# Patient Record
Sex: Female | Born: 1955
Health system: Southern US, Community
[De-identification: ages and names within clinical notes are randomized; demographics above are authoritative.]

## PROBLEM LIST (undated history)

## (undated) DIAGNOSIS — F329 Major depressive disorder, single episode, unspecified: Secondary | ICD-10-CM

## (undated) DIAGNOSIS — F32A Depression, unspecified: Secondary | ICD-10-CM

## (undated) DIAGNOSIS — T7840XA Allergy, unspecified, initial encounter: Secondary | ICD-10-CM

## (undated) DIAGNOSIS — I10 Essential (primary) hypertension: Secondary | ICD-10-CM

## (undated) HISTORY — DX: Essential (primary) hypertension: I10

## (undated) HISTORY — PX: EYE SURGERY: SHX253

## (undated) HISTORY — DX: Allergy, unspecified, initial encounter: T78.40XA

## (undated) HISTORY — PX: WISDOM TOOTH EXTRACTION: SHX21

## (undated) HISTORY — DX: Major depressive disorder, single episode, unspecified: F32.9

## (undated) HISTORY — DX: Depression, unspecified: F32.A

## (undated) HISTORY — PX: ECTOPIC PREGNANCY SURGERY: SHX613

---

## 1998-11-14 ENCOUNTER — Ambulatory Visit (HOSPITAL_COMMUNITY): Admission: RE | Admit: 1998-11-14 | Discharge: 1998-11-14 | Payer: Self-pay | Admitting: Internal Medicine

## 2000-03-18 ENCOUNTER — Other Ambulatory Visit: Admission: RE | Admit: 2000-03-18 | Discharge: 2000-03-18 | Payer: Self-pay | Admitting: Obstetrics and Gynecology

## 2002-09-06 ENCOUNTER — Other Ambulatory Visit: Admission: RE | Admit: 2002-09-06 | Discharge: 2002-09-06 | Payer: Self-pay | Admitting: Obstetrics and Gynecology

## 2003-09-17 ENCOUNTER — Other Ambulatory Visit: Admission: RE | Admit: 2003-09-17 | Discharge: 2003-09-17 | Payer: Self-pay | Admitting: Obstetrics and Gynecology

## 2004-10-15 ENCOUNTER — Other Ambulatory Visit: Admission: RE | Admit: 2004-10-15 | Discharge: 2004-10-15 | Payer: Self-pay | Admitting: Obstetrics and Gynecology

## 2004-12-03 ENCOUNTER — Other Ambulatory Visit: Admission: RE | Admit: 2004-12-03 | Discharge: 2004-12-03 | Payer: Self-pay | Admitting: Obstetrics and Gynecology

## 2006-09-17 ENCOUNTER — Emergency Department (HOSPITAL_COMMUNITY): Admission: EM | Admit: 2006-09-17 | Discharge: 2006-09-18 | Payer: Self-pay | Admitting: Emergency Medicine

## 2008-01-31 ENCOUNTER — Encounter: Payer: Self-pay | Admitting: Internal Medicine

## 2008-05-01 ENCOUNTER — Ambulatory Visit: Payer: Self-pay | Admitting: Internal Medicine

## 2008-05-20 ENCOUNTER — Ambulatory Visit: Payer: Self-pay | Admitting: Internal Medicine

## 2008-06-03 ENCOUNTER — Ambulatory Visit: Payer: Self-pay | Admitting: Internal Medicine

## 2008-06-03 DIAGNOSIS — R1013 Epigastric pain: Secondary | ICD-10-CM

## 2008-06-03 DIAGNOSIS — R1319 Other dysphagia: Secondary | ICD-10-CM | POA: Insufficient documentation

## 2008-06-03 DIAGNOSIS — K219 Gastro-esophageal reflux disease without esophagitis: Secondary | ICD-10-CM

## 2008-06-07 ENCOUNTER — Ambulatory Visit: Payer: Self-pay | Admitting: Internal Medicine

## 2008-06-07 DIAGNOSIS — K2980 Duodenitis without bleeding: Secondary | ICD-10-CM

## 2008-06-07 LAB — CONVERTED CEMR LAB: UREASE: NEGATIVE

## 2008-06-26 ENCOUNTER — Telehealth: Payer: Self-pay | Admitting: Internal Medicine

## 2008-06-28 ENCOUNTER — Telehealth: Payer: Self-pay | Admitting: Internal Medicine

## 2008-07-10 ENCOUNTER — Ambulatory Visit: Payer: Self-pay | Admitting: Internal Medicine

## 2008-07-10 DIAGNOSIS — R197 Diarrhea, unspecified: Secondary | ICD-10-CM

## 2008-07-29 ENCOUNTER — Ambulatory Visit: Payer: Self-pay | Admitting: Internal Medicine

## 2008-07-29 DIAGNOSIS — R5383 Other fatigue: Secondary | ICD-10-CM

## 2008-07-30 LAB — CONVERTED CEMR LAB
ALT: 25 units/L (ref 0–35)
Albumin: 3.9 g/dL (ref 3.5–5.2)
Bilirubin, Direct: 0.1 mg/dL (ref 0.0–0.3)
CO2: 27 meq/L (ref 19–32)
Calcium: 9.1 mg/dL (ref 8.4–10.5)
Creatinine, Ser: 0.6 mg/dL (ref 0.4–1.2)
Ferritin: 35.3 ng/mL (ref 10.0–291.0)
Folate: 11 ng/mL
GFR calc Af Amer: 136 mL/min
HCT: 42 % (ref 36.0–46.0)
Hemoglobin: 14.4 g/dL (ref 12.0–15.0)
Iron: 62 ug/dL (ref 42–145)
MCHC: 34.4 g/dL (ref 30.0–36.0)
Monocytes Absolute: 0.2 10*3/uL (ref 0.1–1.0)
Monocytes Relative: 2.4 % — ABNORMAL LOW (ref 3.0–12.0)
Neutro Abs: 4.2 10*3/uL (ref 1.4–7.7)
Neutrophils Relative %: 59.1 % (ref 43.0–77.0)
Platelets: 243 10*3/uL (ref 150–400)
RBC: 4.73 M/uL (ref 3.87–5.11)
Total Protein: 6.8 g/dL (ref 6.0–8.3)
Transferrin: 192.7 mg/dL — ABNORMAL LOW (ref >=212.0)
Vitamin B-12: 308 pg/mL (ref 211–911)
WBC: 7.1 10*3/uL (ref 4.5–10.5)

## 2008-09-17 ENCOUNTER — Encounter: Payer: Self-pay | Admitting: Internal Medicine

## 2010-02-20 ENCOUNTER — Telehealth: Payer: Self-pay | Admitting: Internal Medicine

## 2010-02-25 ENCOUNTER — Emergency Department (HOSPITAL_COMMUNITY): Admission: EM | Admit: 2010-02-25 | Discharge: 2010-02-25 | Payer: Self-pay | Admitting: Unknown Physician Specialty

## 2010-03-03 ENCOUNTER — Encounter: Admission: RE | Admit: 2010-03-03 | Discharge: 2010-03-03 | Payer: Self-pay | Admitting: Internal Medicine

## 2010-04-07 ENCOUNTER — Encounter: Admission: RE | Admit: 2010-04-07 | Discharge: 2010-04-07 | Payer: Self-pay | Admitting: Internal Medicine

## 2010-09-15 NOTE — Progress Notes (Signed)
Summary: Schedule REV  Phone Note Outgoing Call Call back at [facility] Phone [phone]   Call placed by: Harlow Mares CMA Duncan Dull),  February 20, 2010 10:12 AM Call placed to: Patient Summary of Call: No Answer.. patient is due for a rev for follow up from her last office visit  Initial call taken by: Harlow Mares CMA Duncan Dull),  February 20, 2010 10:13 AM  Follow-up for Phone Call        spoke to a child and he states that the patient "got sick and is not in the hospital" we will mail the pt a letter to remind her she is due for an office visit.  Follow-up by: Harlow Mares CMA Duncan Dull),  February 25, 2010 9:55 AM

## 2010-11-01 LAB — CBC
HCT: 41.2 % (ref 36.0–46.0)
Hemoglobin: 14.4 g/dL (ref 12.0–15.0)
MCH: 30.2 pg (ref 26.0–34.0)
MCHC: 34.8 g/dL (ref 30.0–36.0)
MCV: 86.7 fL (ref 78.0–100.0)

## 2010-11-01 LAB — POCT CARDIAC MARKERS: Myoglobin, poc: 96.1 ng/mL (ref 12–200)

## 2010-11-01 LAB — BASIC METABOLIC PANEL
BUN: 8 mg/dL (ref 6–23)
CO2: 22 mEq/L (ref 19–32)
Calcium: 9 mg/dL (ref 8.4–10.5)
Chloride: 110 mEq/L (ref 96–112)
GFR calc Af Amer: >60 mL/min (ref >60)
GFR calc non Af Amer: >60 mL/min (ref >60)
Glucose, Bld: 105 mg/dL — ABNORMAL HIGH (ref 70–99)
Potassium: 4 mEq/L (ref 3.5–5.1)

## 2010-11-01 LAB — DIFFERENTIAL
Eosinophils Absolute: 0.1 10*3/uL (ref 0.0–0.7)
Eosinophils Relative: 1 % (ref 0–5)
Lymphocytes Relative: 11 % — ABNORMAL LOW (ref 12–46)
Lymphs Abs: 1.4 10*3/uL (ref 0.7–4.0)
Monocytes Absolute: 0.7 10*3/uL (ref 0.1–1.0)

## 2010-11-01 LAB — D-DIMER, QUANTITATIVE: D-Dimer, Quant: 0.43 ug/mL-FEU (ref 0.00–0.48)

## 2011-11-30 ENCOUNTER — Encounter: Payer: Self-pay | Admitting: Internal Medicine

## 2012-11-20 ENCOUNTER — Ambulatory Visit (INDEPENDENT_AMBULATORY_CARE_PROVIDER_SITE_OTHER): Payer: BC Managed Care – PPO | Admitting: Family Medicine

## 2012-11-20 VITALS — BP 110/77 | HR 74 | Temp 97.9°F | Resp 16 | Ht 61.0 in | Wt 162.8 lb

## 2012-11-20 DIAGNOSIS — R55 Syncope and collapse: Secondary | ICD-10-CM

## 2012-11-20 DIAGNOSIS — R509 Fever, unspecified: Secondary | ICD-10-CM

## 2012-11-20 DIAGNOSIS — G8929 Other chronic pain: Secondary | ICD-10-CM

## 2012-11-20 DIAGNOSIS — R110 Nausea: Secondary | ICD-10-CM

## 2012-11-20 DIAGNOSIS — J111 Influenza due to unidentified influenza virus with other respiratory manifestations: Secondary | ICD-10-CM

## 2012-11-20 LAB — POCT CBC
HCT, POC: 43.1 % (ref 37.7–47.9)
MCHC: 32.3 g/dL (ref 31.8–35.4)
MCV: 89.1 fL (ref 80–97)
MID (cbc): 0.7 (ref 0–0.9)
MPV: 8.9 fL (ref 0–99.8)
Platelet Count, POC: 226 10*3/uL (ref 142–424)
RDW, POC: 13.7 %
WBC: 8.4 10*3/uL (ref 4.6–10.2)

## 2012-11-20 LAB — POCT UA - MICROSCOPIC ONLY
Crystals, Ur, HPF, POC: NEGATIVE
RBC, urine, microscopic: NEGATIVE

## 2012-11-20 LAB — POCT URINALYSIS DIPSTICK
Blood, UA: NEGATIVE
Leukocytes, UA: NEGATIVE
Nitrite, UA: NEGATIVE
Urobilinogen, UA: 1

## 2012-11-20 LAB — COMPREHENSIVE METABOLIC PANEL
AST: 24 U/L (ref 0–37)
BUN: 15 mg/dL (ref 6–23)
CO2: 28 mEq/L (ref 19–32)
Calcium: 9 mg/dL (ref 8.4–10.5)
Chloride: 106 mEq/L (ref 96–112)
Glucose, Bld: 86 mg/dL (ref 70–99)
Potassium: 4.3 mEq/L (ref 3.5–5.3)
Total Bilirubin: 0.4 mg/dL (ref 0.3–1.2)

## 2012-11-20 MED ORDER — ALBUTEROL SULFATE HFA 108 (90 BASE) MCG/ACT IN AERS: 2.0000 | INHALATION_SPRAY | RESPIRATORY_TRACT | Status: DC | PRN

## 2012-11-20 MED ORDER — ONDANSETRON 4 MG PO TBDP
8.0000 mg | ORAL_TABLET | Freq: Once | ORAL | Status: AC
  Administered 2012-11-20: 8 mg via ORAL

## 2012-11-20 MED ORDER — ONDANSETRON 8 MG PO TBDP: 8.0000 mg | ORAL_TABLET | Freq: Three times a day (TID) | ORAL | Status: DC | PRN

## 2012-11-20 MED ORDER — MELOXICAM 7.5 MG PO TABS: 7.5000 mg | ORAL_TABLET | Freq: Two times a day (BID) | ORAL | Status: DC | PRN

## 2012-11-20 NOTE — Progress Notes (Signed)
Subjective:    Patient ID: Leslie Romero, female    DOB: 1956/03/07, 57 y.o.   MRN: [id number]   Chief Complaint  Patient presents with  . Generalized Body Aches  . Nausea    HPI  Has been feeling horrible. Started feeling off on Saturday - was very fatigued, then had a lot of copious vomiting all during Sat night.  Then began to feel very ill on Sunday with fever and chills, did not take temp, sweats, diffuse myalgias and arthralgias, neck pain.  She hasn't been able to drink anything - just a few swallows - as it makes her nauseas (though no further vomiting since initial episode.)  Normal urination. Is having diffuse abd pains but the same as diffuse body aches.  Gets very dizzy and presyncopal - no vertigo - but feels like eyes are going to roll back into her head. Was feeling so bad that she almost called 911 to go to the ER but just kept on thinking she would feel better - finally family made her come in today.  No diarrhea or constipation - passing very little stools.  Chest pain with deep breathing - bilateral tightness below breasts radiating up to neck. No cough, does feel SHoB. No sick contacts.  Did take some advil last night due to pain.  Both knees - has chronic knee pain - were very swollen.  Using a lot of icy-hot and taking a lot of baths.  Can't get comfortable at all in any position.  Was on a course of augmentin several weeks ago for sinus infection and bronchitis.   Has been trying to cut down on tobacco - down to 1/2 ppd.  No diagnosis of chronic illness but never feels good - always feels like something is wrong - they just haven't found it yet.  Pt is unable to lay flat - when I asked her to lay down in the office for exam - she repeatedly became panicked and stating that she can't stand the feeling that her brain is jiggling - no vertigo - sensation woke her from sleep last night.  She can lay on her side ok - was laying on her side on the bed when I walked into room  initially.  Past Medical History  Diagnosis Date  . Allergy   . Depression    No current outpatient prescriptions on file prior to visit.   No current facility-administered medications on file prior to visit.   Allergies  Allergen Reactions  . Hydrocodone-Acetaminophen   . Oxycodone-Aspirin     REACTION: hallucinations/ severe itching     Review of Systems  Constitutional: Positive for fever, chills, diaphoresis, activity change, appetite change and fatigue.  HENT: Positive for neck pain and neck stiffness. Negative for ear pain, nosebleeds, congestion, sore throat, rhinorrhea, sneezing, mouth sores, trouble swallowing, voice change, postnasal drip, sinus pressure and ear discharge.   Eyes: Negative for photophobia and pain.  Respiratory: Positive for cough, chest tightness and shortness of breath.   Cardiovascular: Positive for chest pain.  Gastrointestinal: Positive for nausea, vomiting and abdominal pain. Negative for diarrhea and constipation.  Genitourinary: Positive for decreased urine volume. Negative for dysuria and frequency.  Musculoskeletal: Positive for myalgias, back pain, joint swelling, arthralgias and gait problem.  Neurological: Positive for dizziness, weakness, light-headedness and headaches. Negative for syncope.       Presyncope  Hematological: Negative for adenopathy.  Psychiatric/Behavioral: Positive for sleep disturbance.      BP 102/58  Pulse 63  Temp(Src) 97.9 F (36.6 C) (Oral)  Resp 16  Ht 5\' 1"  (1.549 m)  Wt 162 lb 12.8 oz (73.846 kg)  BMI 30.78 kg/m2  SpO2 98% Objective:   Physical Exam  Constitutional: She is oriented to person, place, and time. She appears well-developed and well-nourished. No distress.  HENT:  Head: Normocephalic and atraumatic.  Right Ear: Tympanic membrane, external ear and ear canal normal.  Left Ear: Tympanic membrane, external ear and ear canal normal.  Nose: Rhinorrhea present. No mucosal edema.  Mouth/Throat:  Uvula is midline, oropharynx is clear and moist and mucous membranes are normal. No oropharyngeal exudate.  Eyes: Conjunctivae are normal. Right eye exhibits no discharge. Left eye exhibits no discharge. No scleral icterus.  Neck: Normal range of motion. Neck supple.  Cardiovascular: Normal rate, regular rhythm, normal heart sounds and intact distal pulses.   Pulmonary/Chest: Tachypnea noted. She has decreased breath sounds. She has no wheezes. She has no rhonchi. She has no rales.  Musculoskeletal:       Right knee: Normal. She exhibits no swelling, no effusion and no erythema.       Left knee: Normal. She exhibits no swelling, no effusion and no erythema.  Lymphadenopathy:    She has no cervical adenopathy.  Neurological: She is alert and oriented to person, place, and time. No cranial nerve deficit.  Skin: Skin is warm and dry. She is not diaphoretic. No erythema.  Psychiatric: She has a normal mood and affect. Her behavior is normal.     peak flow reduced at 200 Results for orders placed in visit on 11/20/12  POCT CBC      Result Value Range   WBC 8.4  4.6 - 10.2 K/uL   Lymph, poc 2.8  0.6 - 3.4   POC LYMPH PERCENT 33.6  10 - 50 %L   MID (cbc) 0.7  0 - 0.9   POC MID % 8.4  0 - 12 %M   POC Granulocyte 4.9  2 - 6.9   Granulocyte percent 58.0  37 - 80 %G   RBC 4.84  4.04 - 5.48 M/uL   Hemoglobin 13.9  12.2 - 16.2 g/dL   HCT, POC 40.9  81.1 - 47.9 %   MCV 89.1  80 - 97 fL   MCH, POC 28.7  27 - 31.2 pg   MCHC 32.3  31.8 - 35.4 g/dL   RDW, POC 91.4     Platelet Count, POC 226  142 - 424 K/uL   MPV 8.9  0 - 99.8 fL  POCT URINALYSIS DIPSTICK      Result Value Range   Color, UA dark yellow     Clarity, UA cloudy     Glucose, UA neg     Bilirubin, UA small     Ketones, UA trace     Spec Grav, UA 1.020     Blood, UA neg     pH, UA 6.0     Protein, UA 30     Urobilinogen, UA 1.0     Nitrite, UA neg     Leukocytes, UA Negative    POCT UA - MICROSCOPIC ONLY      Result Value  Range   WBC, Ur, HPF, POC 0-1     RBC, urine, microscopic neg     Bacteria, U Microscopic trace     Mucus, UA neg     Epithelial cells, urine per micros 2-6     Crystals, Ur, HPF,  POC neg     Casts, Ur, LPF, POC neg     Yeast, UA neg     Assessment & Plan:  Influenza-like illness - given zofran 8mg  SL in office now and push water to try to get urine sample. Orthostatics attempted but pt unable to lay flat to obtain.  Continue symptomatic care w/ zofran, mobic, and prn albuterol. RTC if worsening. Pt was able to drink 2 bottles water after zofran, able to lay flat w/o trouble, orthostatics obtained and negative.  Presyncope - very odd - could be hypotension, arrhythymia vs neurological - pt very confident it is not panic. Advised rtc for further eval when feeling better to discuss prior w/u and treatments - consider amb bp monitor, holter, and/or neurology eval.  Knee pain - chronic - rtc for further eval - cons xray and mobic.  GERD - stay away from nsaids.

## 2012-11-20 NOTE — Patient Instructions (Addendum)
Viral Infections  A viral infection can be caused by different types of viruses.Most viral infections are not serious and resolve on their own. However, some infections may cause severe symptoms and may lead to further complications.  SYMPTOMS  Viruses can frequently cause:   Minor sore throat.   Aches and pains.   Headaches.   Runny nose.   Different types of rashes.   Watery eyes.   Tiredness.   Cough.   Loss of appetite.   Gastrointestinal infections, resulting in nausea, vomiting, and diarrhea.  These symptoms do not respond to antibiotics because the infection is not caused by bacteria. However, you might catch a bacterial infection following the viral infection. This is sometimes called a "superinfection." Symptoms of such a bacterial infection may include:   Worsening sore throat with pus and difficulty swallowing.   Swollen neck glands.   Chills and a high or persistent fever.   Severe headache.   Tenderness over the sinuses.   Persistent overall ill feeling (malaise), muscle aches, and tiredness (fatigue).   Persistent cough.   Yellow, green, or brown mucus production with coughing.  HOME CARE INSTRUCTIONS    Only take over-the-counter or prescription medicines for pain, discomfort, diarrhea, or fever as directed by your caregiver.   Drink enough water and fluids to keep your urine clear or pale yellow. Sports drinks can provide valuable electrolytes, sugars, and hydration.   Get plenty of rest and maintain proper nutrition. Soups and broths with crackers or rice are fine.  SEEK IMMEDIATE MEDICAL CARE IF:    You have severe headaches, shortness of breath, chest pain, neck pain, or an unusual rash.   You have uncontrolled vomiting, diarrhea, or you are unable to keep down fluids.   You or your child has an oral temperature above 102 F (38.9 C), not controlled by medicine.   Your baby is older than 3 months with a rectal temperature of 102 F (38.9 C) or higher.   Your baby is 3  months old or younger with a rectal temperature of 100.4 F (38 C) or higher.  MAKE SURE YOU:    Understand these instructions.   Will watch your condition.   Will get help right away if you are not doing well or get worse.  Document Released: 05/12/2005 Document Revised: 10/25/2011 Document Reviewed: 12/07/2010  [employer] Patient Information 2013 [employer].

## 2012-12-28 ENCOUNTER — Ambulatory Visit (INDEPENDENT_AMBULATORY_CARE_PROVIDER_SITE_OTHER): Payer: BC Managed Care – PPO | Admitting: Emergency Medicine

## 2012-12-28 VITALS — BP 142/84 | HR 89 | Temp 98.9°F | Resp 18 | Ht 61.75 in | Wt 165.2 lb

## 2012-12-28 DIAGNOSIS — J209 Acute bronchitis, unspecified: Secondary | ICD-10-CM

## 2012-12-28 DIAGNOSIS — J018 Other acute sinusitis: Secondary | ICD-10-CM

## 2012-12-28 DIAGNOSIS — R0602 Shortness of breath: Secondary | ICD-10-CM

## 2012-12-28 MED ORDER — ALBUTEROL SULFATE (2.5 MG/3ML) 0.083% IN NEBU
2.5000 mg | INHALATION_SOLUTION | Freq: Once | RESPIRATORY_TRACT | Status: AC
  Administered 2012-12-28: 2.5 mg via RESPIRATORY_TRACT

## 2012-12-28 MED ORDER — IPRATROPIUM BROMIDE 0.02 % IN SOLN
0.5000 mg | Freq: Once | RESPIRATORY_TRACT | Status: AC
  Administered 2012-12-28: 0.5 mg via RESPIRATORY_TRACT

## 2012-12-28 MED ORDER — PSEUDOEPHEDRINE-GUAIFENESIN ER 60-600 MG PO TB12: 1.0000 | ORAL_TABLET | Freq: Two times a day (BID) | ORAL | Status: DC

## 2012-12-28 MED ORDER — FLUTICASONE PROPIONATE 50 MCG/ACT NA SUSP: 4.0000 | Freq: Every day | NASAL | Status: DC

## 2012-12-28 MED ORDER — LEVOFLOXACIN 500 MG PO TABS: 500.0000 mg | ORAL_TABLET | Freq: Every day | ORAL | Status: AC

## 2012-12-28 NOTE — Patient Instructions (Addendum)
Metered Dose Inhaler (No Spacer Used) Inhaled medicines are the basis of asthma treatment and other breathing problems. Inhaled medicine can only be effective if used properly. Good technique assures that the medicine reaches the lungs. Metered dose inhalers (MDIs) are used to deliver a variety of inhaled medicines. These include quick relief medicines, controller medicines (such as corticosteroids), and cromolyn. The medicine is delivered by pushing down on a metal canister to release a set amount of spray.  If you are using different kinds of inhalers, use your quick relief medicine to open the airways 10 to 15 minutes before using a steroid. If you are unsure which inhalers to use and the order of using them, ask your caregiver, nurse, or respiratory therapist. HOW TO USE THE INHALER 1. Remove cap from inhaler. 2. Shake inhaler for 5 seconds before each inhalation (breathing in). 3. Position the inhaler so that the top of the canister faces up. 4. Put your index finger on the top of the medication canister. Your thumb supports the bottom of the inhaler. 5. Open your mouth. 6. Hold the inhaler 1 to 2 inches away from your open mouth. This allows the medicine to slow down before the medicine enters the mouth. 7. Exhale (breathe out) normally and as completely as possible. 8. Press the canister down with the index finger to release the medication. 9. At the same time as the canister is pressed, inhale deeply and slowly until the lungs are completely filled. This should take 4 to 6 seconds. Keep your tongue down. 10. Hold the medication in your lungs for up to 10 seconds (10 seconds is best). This helps the medicine get into the small airways of your lungs to work better. 11. Breathe out slowly, through pursed lips. Whistling is an example of pursed lips. 12. Wait at least 1 minute between puffs. Continue with the above steps until you have taken the number of puffs your caregiver has  ordered. 13. Replace cap on inhaler. AVOID:  Inhaling before or after starting the spray of medicine. It takes practice to coordinate your breathing with triggering the spray.  Inhaling through the nose (rather than the mouth) when triggering the spray. HOW TO DETERMINE IF YOUR INHALER IS FULL OR NEARLY EMPTY:  Determine when an inhaler is empty. You cannot know when an MDI canister is empty by shaking it. A few MDIs are now being made with dose counters. Ask your caregiver for a prescription that has a dose counter if you feel you need that extra help.  If your inhaler does not have a counter, check the number of doses in the inhaler before you use it. The canister or box will list the number of doses in the canister. Divide the total number of doses in the canister by the number you will use each day to find how many days the canister will last. (For example, if your canister has 200 doses and you take 2 puffs, 4 times each day, which is 8 puffs a day. Dividing 200 by 8 equals 25. The canister should last 25 days.) Using a calendar, count forward that many days to see when your inhaler will run out. Write the refill date on a calendar or your canister.  Remember, if you need to take extra doses, the inhaler will empty sooner than you figured. Be sure you have a refill before your canister runs out. Refill your inhaler 7 to 10 days before it runs out. HOME CARE INSTRUCTIONS   Do   not use the inhaler more than your caregiver tells you. If you are still wheezing and are feeling tightness in your chest, call your caregiver.  Keep an adequate supply of medication. This includes making sure the medicine is not expired, and you have a spare MDI.  Follow your caregiver or inhaler insert directions for cleaning the inhaler. SEEK MEDICAL CARE IF:   Symptoms are only partially relieved with your inhalers.  You are having trouble using your inhalers.  You experience some increase in phlegm.  You  develop a fever of 102 F (38.9 C). SEEK IMMEDIATE MEDICAL CARE IF:   You feel little or no relief with your inhalers. You are still wheezing and are feeling shortness of breath and/or tightness in your chest.  You have side effects such as dizziness, headaches, or fast heart rate.  You have chills, fever, night sweats or an oral temperature above 102 F (38.9 C) develops.  Phlegm production increases a lot, or there is blood in the phlegm. MAKE SURE YOU:   Understand these instructions.  Will watch your condition.  Will get help right away if you are not doing well or get worse. Document Released: 05/30/2007 Document Revised: 10/25/2011 Document Reviewed: 05/20/2009 [facility] Patient Information 2013 [place], [place].

## 2012-12-28 NOTE — Progress Notes (Signed)
[facility] [address] [place] [address] [phone]  Date:  12/28/2012   Name:  Leslie Romero   DOB:  October 16, 1955   MRN:  [id number]  PCP:  No primary provider on file.    Chief Complaint: Cough, Nasal Congestion and Shortness of Breath   History of Present Illness:  Leslie Romero is a 57 y.o. very pleasant female patient who presents with the following:  Seen three weeks ago and treated for influenza like syndrome.  Really failed to improve and she has nasal congestion and drainage, fever, chills, and a cough productive of mucopurulent sputum.  Some wheezing and shortness of breath with exertion.  No improvement with inhaler twice daily but is not compliant with instructions.  Has malaise and myalgias and severe cough.  No nausea or vomiting.  No stool change.  No rash.  No food intolerance.  Marked post nasal drainage.  Sore throat.  No improvement with over the counter medications or other home remedies. Denies other complaint or health concern today.   Patient Active Problem List   Diagnosis Date Noted  . FATIGUE 07/29/2008  . DIARRHEA-PRESUMED INFECTIOUS 07/10/2008  . DUODENITIS 06/07/2008  . GERD 06/03/2008  . DYSPHAGIA 06/03/2008  . ABDOMINAL PAIN-EPIGASTRIC 06/03/2008    Past Medical History  Diagnosis Date  . Allergy   . Depression     Past Surgical History  Procedure Laterality Date  . Eye surgery      History  Substance Use Topics  . Smoking status: Current Every Day Smoker  . Smokeless tobacco: Not on file  . Alcohol Use: No    Family History  Problem Relation Age of Onset  . Thyroid disease Mother   . COPD Mother   . Thyroid disease Sister     Allergies  Allergen Reactions  . Hydrocodone-Acetaminophen   . Oxycodone-Aspirin     REACTION: hallucinations/ severe itching    Medication list has been reviewed and updated.  Current Outpatient Prescriptions on File Prior to Visit  Medication Sig Dispense Refill  .  albuterol (PROVENTIL HFA;VENTOLIN HFA) 108 (90 BASE) MCG/ACT inhaler Inhale 2 puffs into the lungs every 4 (four) hours as needed for wheezing (cough, shortness of breath or wheezing.).  1 Inhaler  1  . meloxicam (MOBIC) 7.5 MG tablet Take 1 tablet (7.5 mg total) by mouth 2 (two) times daily as needed for pain.  30 tablet  0  . ondansetron (ZOFRAN ODT) 8 MG disintegrating tablet Take 1 tablet (8 mg total) by mouth every 8 (eight) hours as needed for nausea.  20 tablet  0   No current facility-administered medications on file prior to visit.    Review of Systems:  As per HPI, otherwise negative.    Physical Examination: Filed Vitals:   12/28/12 1545  BP: 142/84  Pulse: 89  Temp: 98.9 F (37.2 C)  Resp: 18   Filed Vitals:   12/28/12 1545  Height: 5' 1.75" (1.568 m)  Weight: 165 lb 3.2 oz (74.934 kg)   Body mass index is 30.48 kg/(m^2). Ideal Body Weight: Weight in (lb) to have BMI = 25: 135.3  GEN: WDWN, minimal distress.  Pronounced cough, Non-toxic, A & O x 3 HEENT: Atraumatic, Normocephalic. Neck supple. No masses, No LAD. Ears and Nose: No external deformity. CV: RRR, No M/G/R. No JVD. No thrill. No extra heart sounds. PULM: CTA B, diffuse wheezes, no crackles, rhonchi. No retractions. No resp. distress. No accessory muscle use. ABD:  S, NT, ND, +BS. No rebound. No HSM. EXTR: No c/c/e NEURO Normal gait.  PSYCH: Normally interactive. Conversant. Not depressed or anxious appearing.  Calm demeanor.    Assessment and Plan: Bronchitis with bronchospasm Sinusitis levaquin mucinex d flonase Neb   Signed,  Phillips Odor, MD

## 2013-06-15 ENCOUNTER — Ambulatory Visit (INDEPENDENT_AMBULATORY_CARE_PROVIDER_SITE_OTHER): Payer: BC Managed Care – PPO | Admitting: Family Medicine

## 2013-06-15 ENCOUNTER — Ambulatory Visit: Payer: BC Managed Care – PPO

## 2013-06-15 VITALS — BP 140/68 | HR 97 | Temp 99.1°F | Resp 24

## 2013-06-15 DIAGNOSIS — J209 Acute bronchitis, unspecified: Secondary | ICD-10-CM

## 2013-06-15 DIAGNOSIS — R0602 Shortness of breath: Secondary | ICD-10-CM

## 2013-06-15 DIAGNOSIS — J029 Acute pharyngitis, unspecified: Secondary | ICD-10-CM

## 2013-06-15 DIAGNOSIS — R6889 Other general symptoms and signs: Secondary | ICD-10-CM

## 2013-06-15 LAB — POCT INFLUENZA A/B
Influenza A, POC: NEGATIVE
Influenza B, POC: NEGATIVE

## 2013-06-15 LAB — POCT RAPID STREP A (OFFICE): Rapid Strep A Screen: NEGATIVE

## 2013-06-15 MED ORDER — IPRATROPIUM BROMIDE 0.02 % IN SOLN: 0.5000 mg | Freq: Once | RESPIRATORY_TRACT | Status: AC

## 2013-06-15 MED ORDER — BENZONATATE 100 MG PO CAPS: 100.0000 mg | ORAL_CAPSULE | Freq: Three times a day (TID) | ORAL | Status: DC | PRN

## 2013-06-15 MED ORDER — AMOXICILLIN-POT CLAVULANATE 875-125 MG PO TABS: 1.0000 | ORAL_TABLET | Freq: Two times a day (BID) | ORAL | Status: DC

## 2013-06-15 MED ORDER — ALBUTEROL SULFATE (2.5 MG/3ML) 0.083% IN NEBU: 2.5000 mg | INHALATION_SOLUTION | Freq: Once | RESPIRATORY_TRACT | Status: DC

## 2013-06-15 MED ORDER — METHYLPREDNISOLONE (PAK) 4 MG PO TABS: ORAL_TABLET | ORAL | Status: DC

## 2013-06-15 MED ORDER — HYDROCODONE-HOMATROPINE 5-1.5 MG/5ML PO SYRP: 5.0000 mL | ORAL_SOLUTION | Freq: Three times a day (TID) | ORAL | Status: DC | PRN

## 2013-06-15 NOTE — Patient Instructions (Signed)

## 2013-06-15 NOTE — Progress Notes (Signed)
 [facility]:  Office Visit  Chief Complaint:  Chief Complaint  Patient presents with  . Illness    cough, congestion and SOB since last night    HPI: Leslie Romero is a 57 y.o. female who is here for coughing thick yellow sputum, chest congestion x 2 days Tuesday night she had sore throat. Subjective fevers, chills and msk aches Allergies but no asthma, She smokes but has not been smoking Every year she gets bronchitis and PNA Tried flonase and otc cough meds without releif  Past Medical History  Diagnosis Date  . Allergy   . Depression    Past Surgical History  Procedure Laterality Date  . Eye surgery     History   Social History  . Marital Status: Married    Spouse Name: N/A    Number of Children: N/A  . Years of Education: N/A   Social History Main Topics  . Smoking status: Current Every Day Smoker  . Smokeless tobacco: None  . Alcohol Use: No  . Drug Use: No  . Sexual Activity: Yes    Birth Control/ Protection: None   Other Topics Concern  . None   Social History Narrative  . None   Family History  Problem Relation Age of Onset  . Thyroid disease Mother   . COPD Mother   . Thyroid disease Sister    Allergies  Allergen Reactions  . Oxycodone-Aspirin     REACTION: hallucinations/ severe itching  . Avelox [Moxifloxacin Hcl In Nacl] Rash   Prior to Admission medications   Medication Sig Start Date End Date Taking? Authorizing Provider  albuterol (PROVENTIL HFA;VENTOLIN HFA) 108 (90 BASE) MCG/ACT inhaler Inhale 2 puffs into the lungs every 4 (four) hours as needed for wheezing (cough, shortness of breath or wheezing.). 11/20/12   Sherren Mocha, MD  fluticasone (FLONASE) 50 MCG/ACT nasal spray Place 4 sprays into the nose daily. 12/28/12   Phillips Odor, MD     ROS: The patient denies  night sweats, unintentional weight loss, chest pain, palpitations, dyspnea on exertion, nausea, vomiting, abdominal pain, dysuria, hematuria, melena,  numbness, weakness, or tingling.   All other systems have been reviewed and were otherwise negative with the exception of those mentioned in the HPI and as above.    PHYSICAL EXAM: Filed Vitals:   06/15/13 1200  BP: 140/68  Pulse: 97  Temp: 99.1 F (37.3 C)  Resp: 24   There were no vitals filed for this visit. There is no weight on file to calculate BMI.  General: Alert, no acute distress HEENT:  Normocephalic, atraumatic, oropharynx patent. EOMI, PERRLA Cardiovascular:  Regular rate and rhythm, no rubs murmurs or gallops.  No Carotid bruits, radial pulse intact. No pedal edema.  Respiratory: Clear to auscultation bilaterally.  + minimal wheezes, No rales, or rhonchi.  No cyanosis, no use of accessory musculature GI: No organomegaly, abdomen is soft and non-tender, positive bowel sounds.  No masses. Skin: No rashes. Neurologic: Facial musculature symmetric. Psychiatric: Patient is appropriate throughout our interaction. Lymphatic: No cervical lymphadenopathy Musculoskeletal: Gait intact.   LABS: Results for orders placed in visit on 06/15/13  POCT RAPID STREP A (OFFICE)      Result Value Range   Rapid Strep A Screen Negative  Negative  POCT INFLUENZA A/B      Result Value Range   Influenza A, POC Negative     Influenza B, POC Negative       EKG/XRAY:  Primary read interpreted by Dr. Conley Rolls at [facility]. No pneumo Increase vascular markings less likely infiltrate left hilar region   ASSESSMENT/PLAN: Encounter Diagnoses  Name Primary?  . Flu-like symptoms   . SOB (shortness of breath)   . Acute pharyngitis   . Acute bronchitis Yes   Rx Augmetin Rx hycodan ( she has had this before) Rx Tessalon perles Rx Medrol dose pack, albuterol inhaler C/w Flonase F/u prn or if no improvement in 48-72 hours Felt better after nebs, increase air movement Gross sideeffects, risk and benefits, and alternatives of medications d/w patient. Patient is aware that all medications have  potential sideeffects and we are unable to predict every sideeffect or drug-drug interaction that may occur.  ,  PHUONG, DO 06/15/2013 1:42 PM

## 2014-03-02 ENCOUNTER — Encounter (HOSPITAL_COMMUNITY): Payer: Self-pay | Admitting: Emergency Medicine

## 2014-03-02 ENCOUNTER — Emergency Department (HOSPITAL_COMMUNITY)
Admission: EM | Admit: 2014-03-02 | Discharge: 2014-03-02 | Disposition: A | Discharge status: Home / Self Care | Payer: BC Managed Care – PPO | Attending: Emergency Medicine | Admitting: Emergency Medicine

## 2014-03-02 DIAGNOSIS — Z8659 Personal history of other mental and behavioral disorders: Secondary | ICD-10-CM | POA: Insufficient documentation

## 2014-03-02 DIAGNOSIS — J3489 Other specified disorders of nose and nasal sinuses: Secondary | ICD-10-CM | POA: Insufficient documentation

## 2014-03-02 DIAGNOSIS — F17200 Nicotine dependence, unspecified, uncomplicated: Secondary | ICD-10-CM | POA: Insufficient documentation

## 2014-03-02 DIAGNOSIS — Z7982 Long term (current) use of aspirin: Secondary | ICD-10-CM | POA: Insufficient documentation

## 2014-03-02 DIAGNOSIS — R040 Epistaxis: Secondary | ICD-10-CM | POA: Insufficient documentation

## 2014-03-02 MED ORDER — SULFAMETHOXAZOLE-TRIMETHOPRIM 800-160 MG PO TABS: 1.0000 | ORAL_TABLET | Freq: Two times a day (BID) | ORAL | Status: DC

## 2014-03-02 MED ORDER — CETIRIZINE-PSEUDOEPHEDRINE ER 5-120 MG PO TB12: 1.0000 | ORAL_TABLET | Freq: Every day | ORAL | Status: DC

## 2014-03-02 NOTE — ED Provider Notes (Signed)
Medical screening examination/treatment/procedure(s) were performed by non-physician practitioner and as supervising physician I was immediately available for consultation/collaboration.   EKG Interpretation None        Enid SkeensJoshua M Jacobey Gura, MD 03/02/14 1535

## 2014-03-02 NOTE — ED Provider Notes (Signed)
CSN: [id number]     Arrival date & time 03/02/14  1100 History   First MD Initiated Contact with Patient 03/02/14 1122     Chief Complaint  Patient presents with  . Epistaxis     (Consider location/radiation/quality/duration/timing/severity/associated sxs/prior Treatment) Patient is a 58 y.o. female presenting with nosebleeds. The history is provided by the patient. No language interpreter was used.  Epistaxis Location:  R nare Severity:  Severe Duration:  30 minutes Timing:  Constant Progression:  Resolved Chronicity:  New Context: aspirin use and recent infection   Context: not anticoagulants, not drug use, not elevation change and not nose picking   Relieved by:  Applying pressure Worsened by:  Nothing tried Ineffective treatments:  None tried Associated symptoms: congestion   Associated symptoms: no dizziness and no fever   Congestion:    Location:  Nasal   Interferes with sleep: no     Interferes with eating/drinking: no   Risk factors: allergies   Risk factors: no alcohol use, no change in medication, no frequent nosebleeds, no head and neck surgery and no sinus problems     Past Medical History  Diagnosis Date  . Allergy   . Depression    Past Surgical History  Procedure Laterality Date  . Eye surgery     Family History  Problem Relation Age of Onset  . Thyroid disease Mother   . COPD Mother   . Thyroid disease Sister    History  Substance Use Topics  . Smoking status: Current Every Day Smoker  . Smokeless tobacco: Not on file  . Alcohol Use: No   OB History   Grav Para Term Preterm Abortions TAB SAB Ect Mult Living                 Review of Systems  Constitutional: Negative for fever, chills and fatigue.  HENT: Positive for congestion and nosebleeds. Negative for trouble swallowing.   Eyes: Negative for visual disturbance.  Respiratory: Negative for shortness of breath.   Cardiovascular: Negative for chest pain and palpitations.   Gastrointestinal: Negative for nausea, vomiting, abdominal pain and diarrhea.  Genitourinary: Negative for dysuria and difficulty urinating.  Musculoskeletal: Negative for arthralgias and neck pain.  Skin: Negative for color change.  Neurological: Negative for dizziness and weakness.  Psychiatric/Behavioral: Negative for dysphoric mood.      Allergies  Avelox  Home Medications   Prior to Admission medications   Medication Sig Start Date End Date Taking? Authorizing Provider  albuterol (PROVENTIL HFA;VENTOLIN HFA) 108 (90 BASE) MCG/ACT inhaler Inhale 2 puffs into the lungs every 4 (four) hours as needed for wheezing (cough, shortness of breath or wheezing.). 11/20/12  Yes Sherren MochaEva N Shaw, MD  aspirin EC 81 MG tablet Take 81 mg by mouth daily.   Yes Historical Provider, MD  ibuprofen (ADVIL,MOTRIN) 200 MG tablet Take 200 mg by mouth every 6 (six) hours as needed for headache.   Yes Historical Provider, MD  naproxen sodium (ANAPROX) 220 MG tablet Take 220 mg by mouth 2 (two) times daily as needed. Hip and knee pain   Yes Historical Provider, MD   BP 155/87  Pulse 74  Temp(Src) 98.3 F (36.8 C) (Oral)  Resp 18  SpO2 98% Physical Exam  Nursing note and vitals reviewed. Constitutional: She is oriented to person, place, and time. She appears well-developed and well-nourished. No distress.  HENT:  Head: Normocephalic and atraumatic.  Mouth/Throat: Oropharynx is clear and moist. No oropharyngeal exudate.  Mild maxillary sinus  tenderness to palpation. Moderate amount of dried blood in right nare. No active bleeding noted. Left nare unremarkable.   Eyes: Conjunctivae and EOM are normal.  Neck: Normal range of motion.  Cardiovascular: Normal rate and regular rhythm.  Exam reveals no gallop and no friction rub.   No murmur heard. Pulmonary/Chest: Effort normal and breath sounds normal. She has no wheezes. She has no rales. She exhibits no tenderness.  Musculoskeletal: Normal range of motion.   Neurological: She is alert and oriented to person, place, and time. Coordination normal.  Speech is goal-oriented. Moves limbs without ataxia.   Skin: Skin is warm and dry.  Psychiatric: She has a normal mood and affect. Her behavior is normal.    ED Course  Procedures (including critical care time) Labs Review Labs Reviewed - No data to display  Imaging Review No results found.   EKG Interpretation None      MDM   Final diagnoses:  Acute anterior epistaxis    11:37 AM Patient is no longer having a nosebleed. Patient will be discharged with bactrim for sinusitis and zyrtec for nasal congestion. Vitals stable and patient afebrile. Patient will follow up with PCP.    Emilia Beck, PA-C 03/02/14 1144

## 2014-03-02 NOTE — ED Notes (Signed)
Per pt, woke up this morning and in kitchen to get coffee.  Began having large amount nose of bleeding from right nasal.  Drainage down throat.  Takes ASA 81 mg daily.  No other thinners.  Pt packed nose.  No hx of same.  Has recently been treated for sinus infection and has finished antibiotic.

## 2014-03-02 NOTE — ED Notes (Signed)
Patient reports nose bleed has stopped.  Denies pain.

## 2014-03-02 NOTE — Discharge Instructions (Signed)
Take Bactrim as directed until gone. Take Zyrtec daily for nasal congestion. Refer to attached documents for more information. Follow up with your doctor for further evaluation.

## 2014-05-03 ENCOUNTER — Encounter: Payer: Self-pay | Admitting: Internal Medicine

## 2015-12-27 LAB — CBC AND DIFFERENTIAL
HEMATOCRIT: 42 % (ref 36–46)
HEMOGLOBIN: 14.5 g/dL (ref 12.0–16.0)
Neutrophils Absolute: 8 /uL
PLATELETS: 268 10*3/uL (ref 150–399)
WBC: 11.5 10^3/mL

## 2016-05-28 LAB — C-REACTIVE PROTEIN: CRP: 2.6 mg/dL

## 2016-05-28 LAB — LIPID PANEL
Cholesterol: 198 mg/dL (ref 0–200)
HDL: 47 mg/dL (ref 35–70)
LDL CALC: 117 mg/dL
TRIGLYCERIDES: 168 mg/dL — AB (ref 40–160)

## 2016-05-28 LAB — HM DEXA SCAN

## 2016-05-28 LAB — BASIC METABOLIC PANEL: Glucose: 89 mg/dL

## 2016-06-16 ENCOUNTER — Ambulatory Visit (INDEPENDENT_AMBULATORY_CARE_PROVIDER_SITE_OTHER): Payer: BLUE CROSS/BLUE SHIELD | Admitting: Family Medicine

## 2016-06-16 ENCOUNTER — Encounter: Payer: Self-pay | Admitting: Family Medicine

## 2016-06-16 VITALS — BP 142/80 | HR 80 | Ht 61.75 in | Wt 159.2 lb

## 2016-06-16 DIAGNOSIS — Z716 Tobacco abuse counseling: Secondary | ICD-10-CM

## 2016-06-16 DIAGNOSIS — F4323 Adjustment disorder with mixed anxiety and depressed mood: Secondary | ICD-10-CM

## 2016-06-16 DIAGNOSIS — E663 Overweight: Secondary | ICD-10-CM

## 2016-06-16 DIAGNOSIS — Z8719 Personal history of other diseases of the digestive system: Secondary | ICD-10-CM | POA: Diagnosis not present

## 2016-06-16 DIAGNOSIS — R030 Elevated blood-pressure reading, without diagnosis of hypertension: Secondary | ICD-10-CM

## 2016-06-16 DIAGNOSIS — M25561 Pain in right knee: Secondary | ICD-10-CM

## 2016-06-16 DIAGNOSIS — Z720 Tobacco use: Secondary | ICD-10-CM | POA: Diagnosis not present

## 2016-06-16 DIAGNOSIS — F411 Generalized anxiety disorder: Secondary | ICD-10-CM

## 2016-06-16 DIAGNOSIS — Z818 Family history of other mental and behavioral disorders: Secondary | ICD-10-CM

## 2016-06-16 DIAGNOSIS — K219 Gastro-esophageal reflux disease without esophagitis: Secondary | ICD-10-CM

## 2016-06-16 DIAGNOSIS — R5383 Other fatigue: Secondary | ICD-10-CM

## 2016-06-16 DIAGNOSIS — F17210 Nicotine dependence, cigarettes, uncomplicated: Secondary | ICD-10-CM | POA: Insufficient documentation

## 2016-06-16 DIAGNOSIS — Z8279 Family history of other congenital malformations, deformations and chromosomal abnormalities: Secondary | ICD-10-CM

## 2016-06-16 DIAGNOSIS — F488 Other specified nonpsychotic mental disorders: Secondary | ICD-10-CM

## 2016-06-16 DIAGNOSIS — M25569 Pain in unspecified knee: Secondary | ICD-10-CM | POA: Insufficient documentation

## 2016-06-16 NOTE — Patient Instructions (Addendum)
At the neck my next available appointment, fasting, nothing to eat or drink for over 8 hours. We will obtain labs then and also address her concerns about lung cancer and your right knee pain.   Please think seriously about quitting smoking!  This is very important for your health and well being.    There are many things we can do to help you quit.  Let us know if you are interested.  You can also call 1-800-QUIT-NOW (203) 833-1561) for free smoking cessation counseling and support. And please go online to www.heart.org to the American Heart Association website and search "quit smoking ".   There is a lot of great information on this website for you to look over.   Steps to Quit Smoking  Smoking tobacco can be harmful to your health and can affect almost every organ in your body. Smoking puts you, and those around you, at risk for developing many serious chronic diseases. Quitting smoking is difficult, but it is one of the best things that you can do for your health. It is never too late to quit. WHAT ARE THE BENEFITS OF QUITTING SMOKING? When you quit smoking, you lower your risk of developing serious diseases and conditions, such as:  Lung cancer or lung disease, such as COPD.  Heart disease.  Stroke.  Heart attack.  Infertility.  Osteoporosis and bone fractures. Additionally, symptoms such as coughing, wheezing, and shortness of breath may get better when you quit. You may also find that you get sick less often because your body is stronger at fighting off colds and infections. If you are pregnant, quitting smoking can help to reduce your chances of having a baby of low birth weight. HOW DO I GET READY TO QUIT? When you decide to quit smoking, create a plan to make sure that you are successful. Before you quit:  Pick a date to quit. Set a date within the next two weeks to give you time to prepare.  Write down the reasons why you are quitting. Keep this list in places where you will  see it often, such as on your bathroom mirror or in your car or wallet.  Identify the people, places, things, and activities that make you want to smoke (triggers) and avoid them. Make sure to take these actions:  Throw away all cigarettes at home, at work, and in your car.  Throw away smoking accessories, such as Set designer.  Clean your car and make sure to empty the ashtray.  Clean your home, including curtains and carpets.  Tell your family, friends, and coworkers that you are quitting. Support from your loved ones can make quitting easier.  Talk with your health care provider about your options for quitting smoking.  Find out what treatment options are covered by your health insurance. WHAT STRATEGIES CAN I USE TO QUIT SMOKING?  Talk with your healthcare provider about different strategies to quit smoking. Some strategies include:  Quitting smoking altogether instead of gradually lessening how much you smoke over a period of time. Research shows that quitting "cold [place]" is more successful than gradually quitting.  Attending in-person counseling to help you build problem-solving skills. You are more likely to have success in quitting if you attend several counseling sessions. Even short sessions of 10 minutes can be effective.  Finding resources and support systems that can help you to quit smoking and remain smoke-free after you quit. These resources are most helpful when you use them often. They can  include:  Online chats with a Veterinary surgeoncounselor.  Telephone quitlines.  Printed Materials engineerself-help materials.  Support groups or group counseling.  Text messaging programs.  Mobile phone applications.  Taking medicines to help you quit smoking. (If you are pregnant or breastfeeding, talk with your health care provider first.) Some medicines contain nicotine and some do not. Both types of medicines help with cravings, but the medicines that include nicotine help to relieve  withdrawal symptoms. Your health care provider may recommend:  Nicotine patches, gum, or lozenges.  Nicotine inhalers or sprays.  Non-nicotine medicine that is taken by mouth. Talk with your health care provider about combining strategies, such as taking medicines while you are also receiving in-person counseling. Using these two strategies together makes you more likely to succeed in quitting than if you used either strategy on its own. If you are pregnant or breastfeeding, talk with your health care provider about finding counseling or other support strategies to quit smoking. Do not take medicine to help you quit smoking unless told to do so by your health care provider. WHAT THINGS CAN I DO TO MAKE IT EASIER TO QUIT? Quitting smoking might feel overwhelming at first, but there is a lot that you can do to make it easier. Take these important actions:  Reach out to your family and friends and ask that they support and encourage you during this time. Call telephone quitlines, reach out to support groups, or work with a counselor for support.  Ask people who smoke to avoid smoking around you.  Avoid places that trigger you to smoke, such as bars, parties, or smoke-break areas at work.  Spend time around people who do not smoke.  Lessen stress in your life, because stress can be a smoking trigger for some people. To lessen stress, try:  Exercising regularly.  Deep-breathing exercises.  Yoga.  Meditating.  Performing a body scan. This involves closing your eyes, scanning your body from head to toe, and noticing which parts of your body are particularly tense. Purposefully relax the muscles in those areas.  Download or purchase mobile phone or tablet apps (applications) that can help you stick to your quit plan by providing reminders, tips, and encouragement. There are many free apps, such as QuitGuide from the [employer] developer(Centers for Disease Control and Prevention). You can find other support  for quitting smoking (smoking cessation) through smokefree.gov and other websites. HOW WILL I FEEL WHEN I QUIT SMOKING? Within the first 24 hours of quitting smoking, you may start to feel some withdrawal symptoms. These symptoms are usually most noticeable 2-3 days after quitting, but they usually do not last beyond 2-3 weeks. Changes or symptoms that you might experience include:  Mood swings.  Restlessness, anxiety, or irritation.  Difficulty concentrating.  Dizziness.  Strong cravings for sugary foods in addition to nicotine.  Mild weight gain.  Constipation.  Nausea.  Coughing or a sore throat.  Changes in how your medicines work in your body.  A depressed mood.  Difficulty sleeping (insomnia). After the first 2-3 weeks of quitting, you may start to notice more positive results, such as:  Improved sense of smell and taste.  Decreased coughing and sore throat.  Slower heart rate.  Lower blood pressure.  Clearer skin.  The ability to breathe more easily.  Fewer sick days. Quitting smoking is very challenging for most people. Do not get discouraged if you are not successful the first time. Some people need to make many attempts to quit before they  achieve long-term success. Do your best to stick to your quit plan, and talk with your health care provider if you have any questions or concerns.   This information is not intended to replace advice given to you by your health care provider. Make sure you discuss any questions you have with your health care provider.   Document Released: 07/27/2001 Document Revised: 12/17/2014 Document Reviewed: 12/17/2014 Elsevier Interactive Patient Education 2016 [employer].      Smoking Cessation, Tips for Success If you are ready to quit smoking, congratulations! You have chosen to help yourself be healthier. Cigarettes bring nicotine, tar, carbon monoxide, and other irritants into your body. Your lungs, heart, and blood  vessels will be able to work better without these poisons. There are many different ways to quit smoking. Nicotine gum, nicotine patches, a nicotine inhaler, or nicotine nasal spray can help with physical craving. Hypnosis, support groups, and medicines help break the habit of smoking. WHAT THINGS CAN I DO TO MAKE QUITTING EASIER?  Here are some tips to help you quit for good:  Pick a date when you will quit smoking completely. Tell all of your friends and family about your plan to quit on that date.  Do not try to slowly cut down on the number of cigarettes you are smoking. Pick a quit date and quit smoking completely starting on that day.  Throw away all cigarettes.   Clean and remove all ashtrays from your home, work, and car.  On a card, write down your reasons for quitting. Carry the card with you and read it when you get the urge to smoke.  Cleanse your body of nicotine. Drink enough water and fluids to keep your urine clear or pale yellow. Do this after quitting to flush the nicotine from your body.  Learn to predict your moods. Do not let a bad situation be your excuse to have a cigarette. Some situations in your life might tempt you into wanting a cigarette.  Never have "just one" cigarette. It leads to wanting another and another. Remind yourself of your decision to quit.  Change habits associated with smoking. If you smoked while driving or when feeling stressed, try other activities to replace smoking. Stand up when drinking your coffee. Brush your teeth after eating. Sit in a different chair when you read the paper. Avoid alcohol while trying to quit, and try to drink fewer caffeinated beverages. Alcohol and caffeine may urge you to smoke.  Avoid foods and drinks that can trigger a desire to smoke, such as sugary or spicy foods and alcohol.  Ask people who smoke not to smoke around you.  Have something planned to do right after eating or having a cup of coffee. For example,  plan to take a walk or exercise.  Try a relaxation exercise to calm you down and decrease your stress. Remember, you may be tense and nervous for the first 2 weeks after you quit, but this will pass.  Find new activities to keep your hands busy. Play with a pen, coin, or rubber band. Doodle or draw things on paper.  Brush your teeth right after eating. This will help cut down on the craving for the taste of tobacco after meals. You can also try mouthwash.   Use oral substitutes in place of cigarettes. Try using lemon drops, carrots, cinnamon sticks, or chewing gum. Keep them handy so they are available when you have the urge to smoke.  When you have the urge  to smoke, try deep breathing.  Designate your home as a nonsmoking area.  If you are a heavy smoker, ask your health care provider about a prescription for nicotine chewing gum. It can ease your withdrawal from nicotine.  Reward yourself. Set aside the cigarette money you save and buy yourself something nice.  Look for support from others. Join a support group or smoking cessation program. Ask someone at home or at work to help you with your plan to quit smoking.  Always ask yourself, "Do I need this cigarette or is this just a reflex?" Tell yourself, "Today, I choose not to smoke," or "I do not want to smoke." You are reminding yourself of your decision to quit.  Do not replace cigarette smoking with electronic cigarettes (commonly called e-cigarettes). The safety of e-cigarettes is unknown, and some may contain harmful chemicals.  If you relapse, do not give up! Plan ahead and think about what you will do the next time you get the urge to smoke. HOW WILL I FEEL WHEN I QUIT SMOKING? You may have symptoms of withdrawal because your body is used to nicotine (the addictive substance in cigarettes). You may crave cigarettes, be irritable, feel very hungry, cough often, get headaches, or have difficulty concentrating. The withdrawal  symptoms are only temporary. They are strongest when you first quit but will go away within 10-14 days. When withdrawal symptoms occur, stay in control. Think about your reasons for quitting. Remind yourself that these are signs that your body is healing and getting used to being without cigarettes. Remember that withdrawal symptoms are easier to treat than the major diseases that smoking can cause.  Even after the withdrawal is over, expect periodic urges to smoke. However, these cravings are generally short lived and will go away whether you smoke or not. Do not smoke! WHAT RESOURCES ARE AVAILABLE TO HELP ME QUIT SMOKING? Your health care provider can direct you to community resources or hospitals for support, which may include:  Group support.  Education.  Hypnosis.  Therapy.   This information is not intended to replace advice given to you by your health care provider. Make sure you discuss any questions you have with your health care provider.   Document Released: 04/30/2004 Document Revised: 08/23/2014 Document Reviewed: 01/18/2013 Elsevier Interactive Patient Education 2016 Elsevier Inc.    Osteoarthritis Osteoarthritis is a disease that causes soreness and inflammation of a joint. It occurs when the cartilage at the affected joint wears down. Cartilage acts as a cushion, covering the ends of bones where they meet to form a joint. Osteoarthritis is the most common form of arthritis. It often occurs in older people. The joints affected most often by this condition include those in the:  Ends of the fingers.  Thumbs.  Neck.  Lower back.  Knees.  Hips. CAUSES  Over time, the cartilage that covers the ends of bones begins to wear away. This causes bone to rub on bone, producing pain and stiffness in the affected joints.  RISK FACTORS Certain factors can increase your chances of having osteoarthritis, including:  Older age.  Excessive body weight.  Overuse of  joints.  Previous joint injury. SIGNS AND SYMPTOMS   Pain, swelling, and stiffness in the joint.  Over time, the joint may lose its normal shape.  Small deposits of bone (osteophytes) may grow on the edges of the joint.  Bits of bone or cartilage can break off and float inside the joint space. This may cause more  pain and damage. DIAGNOSIS  Your health care provider will do a physical exam and ask about your symptoms. Various tests may be ordered, such as:  X-rays of the affected joint.  Blood tests to rule out other types of arthritis. Additional tests may be used to diagnose your condition. TREATMENT  Goals of treatment are to control pain and improve joint function. Treatment plans may include:  A prescribed exercise program that allows for rest and joint relief.  A weight control plan.  Pain relief techniques, such as:  Properly applied heat and cold.  Electric pulses delivered to nerve endings under the skin (transcutaneous electrical nerve stimulation [TENS]).  Massage.  Certain nutritional supplements.  Medicines to control pain, such as:  Acetaminophen.  Nonsteroidal anti-inflammatory drugs (NSAIDs), such as naproxen.  Narcotic or central-acting agents, such as tramadol.  Corticosteroids. These can be given orally or as an injection.  Surgery to reposition the bones and relieve pain (osteotomy) or to remove loose pieces of bone and cartilage. Joint replacement may be needed in advanced states of osteoarthritis. HOME CARE INSTRUCTIONS   Take medicines only as directed by your health care provider.  Maintain a healthy weight. Follow your health care provider's instructions for weight control. This may include dietary instructions.  Exercise as directed. Your health care provider can recommend specific types of exercise. These may include:  Strengthening exercises. These are done to strengthen the muscles that support joints affected by arthritis. They can  be performed with weights or with exercise bands to add resistance.  Aerobic activities. These are exercises, such as brisk walking or low-impact aerobics, that get your heart pumping.  Range-of-motion activities. These keep your joints limber.  Balance and agility exercises. These help you maintain daily living skills.  Rest your affected joints as directed by your health care provider.  Keep all follow-up visits as directed by your health care provider. SEEK MEDICAL CARE IF:   Your skin turns red.  You develop a rash in addition to your joint pain.  You have worsening joint pain.  You have a fever along with joint or muscle aches. SEEK IMMEDIATE MEDICAL CARE IF:  You have a significant loss of weight or appetite.  You have night sweats. FOR MORE INFORMATION   National Institute of Arthritis and Musculoskeletal and Skin Diseases: www.niams.http://www.myers.net/  [employer] on Aging: https://walker.com/  American College of Rheumatology: www.rheumatology.org   This information is not intended to replace advice given to you by your health care provider. Make sure you discuss any questions you have with your health care provider.   Document Released: 08/02/2005 Document Revised: 08/23/2014 Document Reviewed: 04/09/2013 Elsevier Interactive Patient Education [employer]

## 2016-06-16 NOTE — Progress Notes (Signed)
New patient office visit note:  Impression and Recommendations:    1. Overweight (BMI 25.0-29.9)   2. Recurrent pain of right knee   3. History of gastritis   4. Continuous tobacco abuse   5. Tobacco abuse counseling   6. Gastroesophageal reflux disease, esophagitis presence not specified   7. Family history of Downs syndrome- son; pt is caretaker   8. Family history of bipolar disorder   9. GAD (generalized anxiety disorder)   10. Mental exhaustion   11. Adjustment disorder with mixed anxiety and depressed mood   12. Elevated blood pressure reading in office without diagnosis of hypertension    - Come fasting to my next available appointment, fasting, nothing to eat or drink for over 8 hours.  We will obtain labs then and also address her concerns about lung cancer and your right knee pain.    There are other non-urgent complaints, but due to my schedule and the amount of time I've already spent with her, time does not permit me to address these routine issues at today's visit. I've requested another appointment to review these additional issues.  ->10 min SMoking Cess counseling done: Advised nicotine patches since she unable to tolerate so many other meds.   She declined smoking cess counselor/ specialist.   Please think seriously about quitting smoking!  This is very important for your health and well being.   There are many things we can do to help you quit.  Let us know if you are interested. You can also call 1-800-QUIT-NOW (364)843-2505([phone] 191-0484-669) for free smoking cessation counseling and support. And please go online to www.heart.org to the American Heart Association website and search "quit smoking ".   There is a lot of great information on this website for you to look over.   Recurrent knee pain- R  Since 60yo.  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  Saw primary care doc but never had x-rays; shots etc.  T  akes Tylenol   Family  history of Downs syndrome- son; pt is caretaker - I allowed pt just to tell her story and unload her stress today in office.     Pt was in the office today for 40+ minutes, with over 50% time spent in face to face counseling of various medical concerns and in coordination of care  - D/c pt stress mgt and support groups for caretakers of bi-polar and/or Down's Syn people.  Advised she get support!    - she will ask her son's psychiatrist  son w/ bipolar disorder Son with bi-polar and violent outbursts.  A lot stress for her.    Declines meds.   Advised counseling   History of gastritis GI Doc at [facility]- did EGD and colonoscopy for chronic epigastric abd pain.  Pt states he found + H Pylori- txed with meds couple weeks.    Sx stable now- no complaints  GERD Cont omeprazole QD  Continuous tobacco abuse -  >er 10 min counseling done: Advised nicotine patches since she unable to tolerate so many other meds.   She declined smoking cess counselor/ specialist.    Elevated blood pressure reading in office without diagnosis of hypertension Counseling done--> BP not at goal of 130/80 or less.  Check BP at home if possible.  Counseling re: lifestyle modifications    Med list updated by CMA today: Discontinued Medications   ASPIRIN EC 81 MG TABLET  Take 81 mg by mouth daily.   CETIRIZINE-PSEUDOEPHEDRINE (ZYRTEC-D) 5-120 MG PER TABLET    Take 1 tablet by mouth daily.   NAPROXEN SODIUM (ANAPROX) 220 MG TABLET    Take 220 mg by mouth 2 (two) times daily as needed. Hip and knee pain   SULFAMETHOXAZOLE-TRIMETHOPRIM (SEPTRA DS) 800-160 MG PER TABLET    Take 1 tablet by mouth every 12 (twelve) hours.    Return for Follow-up my next available appt..  The patient was counseled, risk factors were discussed, anticipatory guidance given.  Gross side effects, risk and benefits, and alternatives of medications discussed with patient.  Patient is aware that all medications have potential side  effects and we are unable to predict every side effect or drug-drug interaction that may occur.  Expresses verbal understanding and consents to current therapy plan and treatment regimen.  Please see AVS handed out to patient at the end of our visit for further patient instructions/ counseling done pertaining to today's office visit.    Note: This document was prepared using Dragon voice recognition software and may include unintentional dictation errors.  ----------------------------------------------------------------------------------------------------------------------    Subjective:    Chief Complaint  Patient presents with  . Establish Care    HPI: Leslie Romero is a pleasant 60 y.o. female who presents to [facility] today to review their medical history with me and establish care.   I asked the patient to review their chronic problem list with me to ensure everything was updated and accurate.     Been about 5 yrs or more since last true physical; 10 yrs for OB-GYN exam.     PCP- none.  1.5 ppd for 30+ yrs, now at 3/4ppd.  Wants to quit but not emotionally ready.  Failed wellbutrin and chantix-->  Couldn't tolerate meds- " made her brain feel weird.".   H/o being on paxil, prozac, XANAX when son died in 01/22/03.  "I have gone down hill since then.  Raising grand kids since 40 and 57 yo.  They are late teens now.    Pt has son with Down's Syn- has bi-polar, he gets violent and pt is sole care taker.  60yo.  Has psychiatrists.   Pt is very stressed.  Breaks furniture and smashes neighbor's car with a shovel etc.   Pt worried she has lung CA-  Cousins dying with it and Dad died of it age 63.   Never had LD CT Scan to screen lung CA.  GI Doc--> [facility]- did EGD and scope below.   Cherylann Ratel.  Doctor at [place] [place].    Patient Care Team    Relationship Specialty Notifications Start End  Thomasene Lot, DO PCP - General Family  Medicine  06/16/16      Wt Readings from Last 3 Encounters:  06/16/16 159 lb 3.2 oz (72.2 kg)  12/28/12 165 lb 3.2 oz (74.9 kg)  11/20/12 162 lb 12.8 oz (73.8 kg)   BP Readings from Last 3 Encounters:  06/16/16 (!) 142/80  03/02/14 155/87  06/15/13 140/68   Pulse Readings from Last 3 Encounters:  06/16/16 80  03/02/14 74  06/15/13 97   BMI Readings from Last 3 Encounters:  06/16/16 29.35 kg/m  12/28/12 30.46 kg/m  11/20/12 30.76 kg/m     Patient Active Problem List   Diagnosis Date Noted  . Overweight (BMI 25.0-29.9) 06/20/2016  . 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 son  w/ bipolar disorder 06/20/2016  . GAD (generalized anxiety disorder) 06/20/2016  . Adjustment disorder with mixed anxiety and depressed mood 06/20/2016  . Elevated blood pressure reading in office without diagnosis of hypertension 06/20/2016  . Recurrent knee pain- R  06/16/2016  . History of gastritis 06/16/2016  . Continuous tobacco abuse 06/16/2016  . Tobacco abuse counseling 06/16/2016  . FATIGUE 07/29/2008  . DIARRHEA-PRESUMED INFECTIOUS 07/10/2008  . Duodenitis determined by biopsy '09 06/07/2008  . GERD 06/03/2008  . DYSPHAGIA 06/03/2008  . ABDOMINAL PAIN-EPIGASTRIC 06/03/2008     Past Medical History:  Diagnosis Date  . Allergy   . Depression   . Hypertension      Past Surgical History:  Procedure Laterality Date  . ECTOPIC PREGNANCY SURGERY     x 2  . EYE SURGERY    . WISDOM TOOTH EXTRACTION       Family History  Problem Relation Age of Onset  . Thyroid disease Mother   . COPD Mother   . Cancer Father     lung  . Diabetes Father   . Hyperlipidemia Father   . Hypertension Father   . Stroke Father   . Thyroid disease Sister   . Diabetes Sister      History  Drug Use No    History  Alcohol Use No    History  Smoking Status  . Current Every Day Smoker  . Packs/day: 1.00  . Years: 40.00  . Types: Cigarettes  Smokeless  Tobacco  . Not on file    Patient's Medications  New Prescriptions   No medications on file  Previous Medications   ALBUTEROL (PROVENTIL HFA;VENTOLIN HFA) 108 (90 BASE) MCG/ACT INHALER    Inhale 2 puffs into the lungs every 4 (four) hours as needed for wheezing (cough, shortness of breath or wheezing.).   IBUPROFEN (ADVIL,MOTRIN) 200 MG TABLET    Take 200 mg by mouth every 6 (six) hours as needed for headache.   OMEPRAZOLE (PRILOSEC) 40 MG CAPSULE    Take 1 capsule by mouth daily.  Modified Medications   No medications on file  Discontinued Medications   ASPIRIN EC 81 MG TABLET    Take 81 mg by mouth daily.   CETIRIZINE-PSEUDOEPHEDRINE (ZYRTEC-D) 5-120 MG PER TABLET    Take 1 tablet by mouth daily.   NAPROXEN SODIUM (ANAPROX) 220 MG TABLET    Take 220 mg by mouth 2 (two) times daily as needed. Hip and knee pain   SULFAMETHOXAZOLE-TRIMETHOPRIM (SEPTRA DS) 800-160 MG PER TABLET    Take 1 tablet by mouth every 12 (twelve) hours.    Allergies: Moxifloxacin and Oxycodone-acetaminophen  Review of Systems  Constitutional: Positive for malaise/fatigue. Negative for chills, diaphoresis and fever.  HENT: Negative.  Negative for congestion, ear pain, sinus pain and sore throat.   Eyes: Negative.  Negative for blurred vision and double vision.  Respiratory: Positive for cough. Negative for sputum production, shortness of breath and wheezing.   Cardiovascular: Negative.  Negative for chest pain, palpitations and leg swelling.  Gastrointestinal: Positive for heartburn. Negative for abdominal pain, constipation, diarrhea, nausea and vomiting.  Genitourinary: Negative.  Negative for frequency and urgency.       Nocturia  Musculoskeletal: Positive for back pain, joint pain, myalgias and neck pain. Negative for falls.  Skin: Positive for rash. Negative for itching.       Nipple discharge  Neurological: Positive for weakness. Negative for dizziness, sensory change and focal weakness.    Endo/Heme/Allergies: Positive for environmental  allergies. Does not bruise/bleed easily.  Psychiatric/Behavioral: Positive for depression. Negative for hallucinations, substance abuse and suicidal ideas. The patient is nervous/anxious and has insomnia.      Objective:   Blood pressure (!) 142/80, pulse 80, height 5' 1.75" (1.568 m), weight 159 lb 3.2 oz (72.2 kg). Body mass index is 29.35 kg/m. General: Well Developed, well nourished, and in no acute distress.  Neuro: Alert and oriented x3, extra-ocular muscles intact, sensation grossly intact.  HEENT: Normocephalic, atraumatic, pupils equal round reactive to light, neck supple Skin: no gross suspicious lesions or rashes  Cardiac: Regular rate and rhythm, no murmurs rubs or gallops.  Respiratory: Essentially clear to auscultation bilaterally w/o wh, + coarse BS thru-out. Not using accessory muscles, speaking in full sentences.  Abdominal: Soft, not grossly distended Musculoskeletal: Ambulates w/o diff, FROM * 4 ext.  Vasc: less 2 sec cap RF, warm and pink  Psych:  No HI/SI, judgement and insight good, Euthymic mood. Full Affect.

## 2016-06-16 NOTE — Assessment & Plan Note (Addendum)
Since 60yo.  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  Saw primary care doc but never had x-rays; shots etc.  T  akes Tylenol

## 2016-06-20 ENCOUNTER — Encounter: Payer: Self-pay | Admitting: Family Medicine

## 2016-06-20 DIAGNOSIS — F4323 Adjustment disorder with mixed anxiety and depressed mood: Secondary | ICD-10-CM | POA: Insufficient documentation

## 2016-06-20 DIAGNOSIS — E661 Drug-induced obesity: Secondary | ICD-10-CM | POA: Insufficient documentation

## 2016-06-20 DIAGNOSIS — F411 Generalized anxiety disorder: Secondary | ICD-10-CM | POA: Insufficient documentation

## 2016-06-20 DIAGNOSIS — R030 Elevated blood-pressure reading, without diagnosis of hypertension: Secondary | ICD-10-CM | POA: Insufficient documentation

## 2016-06-20 DIAGNOSIS — Z818 Family history of other mental and behavioral disorders: Secondary | ICD-10-CM | POA: Insufficient documentation

## 2016-06-20 DIAGNOSIS — Z8279 Family history of other congenital malformations, deformations and chromosomal abnormalities: Secondary | ICD-10-CM | POA: Insufficient documentation

## 2016-06-20 NOTE — Assessment & Plan Note (Addendum)
-   I allowed pt just to tell her story and unload her stress today in office.     Pt was in the office today for 40+ minutes, with over 50% time spent in face to face counseling of various medical concerns and in coordination of care  - D/c pt stress mgt and support groups for caretakers of bi-polar and/or Down's Syn people.  Advised she get support!    - she will ask her son's psychiatrist

## 2016-06-20 NOTE — Assessment & Plan Note (Addendum)
Counseling done--> BP not at goal of 130/80 or less.  Check BP at home if possible.  Counseling re: lifestyle modifications

## 2016-06-20 NOTE — Assessment & Plan Note (Signed)
GI Doc at [facility]- did EGD and colonoscopy for chronic epigastric abd pain.  Pt states he found + H Pylori- txed with meds couple weeks.    Sx stable now- no complaints

## 2016-06-20 NOTE — Assessment & Plan Note (Addendum)
-  >  er 10 min counseling done: Advised nicotine patches since she unable to tolerate so many other meds.   She declined smoking cess counselor/ specialist.

## 2016-06-20 NOTE — Assessment & Plan Note (Signed)
Son with bi-polar and violent outbursts.  A lot stress for her.    Declines meds.   Advised counseling

## 2016-06-20 NOTE — Assessment & Plan Note (Signed)
Cont omeprazole QD

## 2016-06-23 ENCOUNTER — Other Ambulatory Visit (INDEPENDENT_AMBULATORY_CARE_PROVIDER_SITE_OTHER): Payer: BLUE CROSS/BLUE SHIELD

## 2016-06-23 ENCOUNTER — Other Ambulatory Visit: Payer: Self-pay

## 2016-06-23 DIAGNOSIS — R7309 Other abnormal glucose: Secondary | ICD-10-CM

## 2016-06-23 DIAGNOSIS — Z1321 Encounter for screening for nutritional disorder: Secondary | ICD-10-CM

## 2016-06-23 DIAGNOSIS — R5383 Other fatigue: Secondary | ICD-10-CM

## 2016-06-23 DIAGNOSIS — Z1329 Encounter for screening for other suspected endocrine disorder: Secondary | ICD-10-CM

## 2016-06-23 DIAGNOSIS — Z130 Encounter for screening for diseases of the blood and blood-forming organs and certain disorders involving the immune mechanism: Secondary | ICD-10-CM

## 2016-06-23 LAB — CBC WITH DIFFERENTIAL/PLATELET
Basophils Absolute: 68 cells/uL (ref 0–200)
Basophils Relative: 1 %
EOS ABS: 204 {cells}/uL (ref 15–500)
Eosinophils Relative: 3 %
HEMATOCRIT: 43 % (ref 35.0–45.0)
Hemoglobin: 14.4 g/dL (ref 11.7–15.5)
LYMPHS PCT: 40 %
Lymphs Abs: 2720 cells/uL (ref 850–3900)
MCH: 28.7 pg (ref 27.0–33.0)
MCHC: 33.5 g/dL (ref 32.0–36.0)
MCV: 85.8 fL (ref 80.0–100.0)
MONO ABS: 408 {cells}/uL (ref 200–950)
MPV: 9.3 fL (ref 7.5–12.5)
Monocytes Relative: 6 %
NEUTROS PCT: 50 %
Neutro Abs: 3400 cells/uL (ref 1500–7800)
Platelets: 282 10*3/uL (ref 140–400)
RBC: 5.01 MIL/uL (ref 3.80–5.10)
RDW: 14.7 % (ref 11.0–15.0)
WBC: 6.8 10*3/uL (ref 3.8–10.8)

## 2016-06-24 LAB — VITAMIN D 25 HYDROXY (VIT D DEFICIENCY, FRACTURES): Vit D, 25-Hydroxy: 22 ng/mL — ABNORMAL LOW (ref 30–100)

## 2016-06-24 LAB — COMPLETE METABOLIC PANEL WITH GFR
ALT: 15 U/L (ref 6–29)
AST: 19 U/L (ref 10–35)
Albumin: 4.2 g/dL (ref 3.6–5.1)
Alkaline Phosphatase: 51 U/L (ref 33–130)
BUN: 13 mg/dL (ref 7–25)
CALCIUM: 9.2 mg/dL (ref 8.6–10.4)
CHLORIDE: 108 mmol/L (ref 98–110)
CO2: 25 mmol/L (ref 20–31)
CREATININE: 0.56 mg/dL (ref 0.50–1.05)
GFR, Est African American: >89 mL/min (ref >=60)
GFR, Est Non African American: >89 mL/min (ref >=60)
Glucose, Bld: 93 mg/dL (ref 65–99)
Potassium: 4.2 mmol/L (ref 3.5–5.3)
Sodium: 141 mmol/L (ref 135–146)
Total Bilirubin: 0.4 mg/dL (ref 0.2–1.2)
Total Protein: 6.7 g/dL (ref 6.1–8.1)

## 2016-06-24 LAB — TSH: TSH: 2.15 mIU/L

## 2016-06-24 LAB — HEMOGLOBIN A1C
Hgb A1c MFr Bld: 5.5 % (ref <5.7)
Mean Plasma Glucose: 111 mg/dL

## 2016-06-28 ENCOUNTER — Other Ambulatory Visit: Payer: Self-pay

## 2016-06-28 MED ORDER — VITAMIN D (ERGOCALCIFEROL) 1.25 MG (50000 UNIT) PO CAPS: 50000.0000 [IU] | ORAL_CAPSULE | ORAL | 3 refills | Status: DC

## 2016-07-13 ENCOUNTER — Ambulatory Visit: Payer: BLUE CROSS/BLUE SHIELD | Admitting: Family Medicine

## 2016-07-20 ENCOUNTER — Encounter: Payer: Self-pay | Admitting: Family Medicine

## 2016-07-20 ENCOUNTER — Ambulatory Visit (INDEPENDENT_AMBULATORY_CARE_PROVIDER_SITE_OTHER): Payer: BLUE CROSS/BLUE SHIELD | Admitting: Family Medicine

## 2016-07-20 VITALS — BP 145/84 | HR 80 | Ht 61.75 in | Wt 168.7 lb

## 2016-07-20 DIAGNOSIS — E559 Vitamin D deficiency, unspecified: Secondary | ICD-10-CM | POA: Diagnosis not present

## 2016-07-20 DIAGNOSIS — E661 Drug-induced obesity: Secondary | ICD-10-CM

## 2016-07-20 DIAGNOSIS — Z716 Tobacco abuse counseling: Secondary | ICD-10-CM

## 2016-07-20 DIAGNOSIS — R0602 Shortness of breath: Secondary | ICD-10-CM | POA: Diagnosis not present

## 2016-07-20 DIAGNOSIS — F17200 Nicotine dependence, unspecified, uncomplicated: Secondary | ICD-10-CM

## 2016-07-20 DIAGNOSIS — Z122 Encounter for screening for malignant neoplasm of respiratory organs: Secondary | ICD-10-CM | POA: Insufficient documentation

## 2016-07-20 DIAGNOSIS — R5382 Chronic fatigue, unspecified: Secondary | ICD-10-CM

## 2016-07-20 NOTE — Patient Instructions (Signed)
Steps to Quit Smoking Smoking tobacco can be harmful to your health and can affect almost every organ in your body. Smoking puts you, and those around you, at risk for developing many serious chronic diseases. Quitting smoking is difficult, but it is one of the best things that you can do for your health. It is never too late to quit. What are the benefits of quitting smoking? When you quit smoking, you lower your risk of developing serious diseases and conditions, such as:  Lung cancer or lung disease, such as COPD.  Heart disease.  Stroke.  Heart attack.  Infertility.  Osteoporosis and bone fractures. Additionally, symptoms such as coughing, wheezing, and shortness of breath may get better when you quit. You may also find that you get sick less often because your body is stronger at fighting off colds and infections. If you are pregnant, quitting smoking can help to reduce your chances of having a baby of low birth weight. How do I get ready to quit? When you decide to quit smoking, create a plan to make sure that you are successful. Before you quit:  Pick a date to quit. Set a date within the next two weeks to give you time to prepare.  Write down the reasons why you are quitting. Keep this list in places where you will see it often, such as on your bathroom mirror or in your car or wallet.  Identify the people, places, things, and activities that make you want to smoke (triggers) and avoid them. Make sure to take these actions:  Throw away all cigarettes at home, at work, and in your car.  Throw away smoking accessories, such as Scientist, research (medical).  Clean your car and make sure to empty the ashtray.  Clean your home, including curtains and carpets.  Tell your family, friends, and coworkers that you are quitting. Support from your loved ones can make quitting easier.  Talk with your health care provider about your options for quitting smoking.  Find out what treatment  options are covered by your health insurance. What strategies can I use to quit smoking? Talk with your healthcare provider about different strategies to quit smoking. Some strategies include:  Quitting smoking altogether instead of gradually lessening how much you smoke over a period of time. Research shows that quitting "cold [place]" is more successful than gradually quitting.  Attending in-person counseling to help you build problem-solving skills. You are more likely to have success in quitting if you attend several counseling sessions. Even short sessions of 10 minutes can be effective.  Finding resources and support systems that can help you to quit smoking and remain smoke-free after you quit. These resources are most helpful when you use them often. They can include:  Online chats with a Social worker.  Telephone quitlines.  Printed Furniture conservator/restorer.  Support groups or group counseling.  Text messaging programs.  Mobile phone applications.  Taking medicines to help you quit smoking. (If you are pregnant or breastfeeding, talk with your health care provider first.) Some medicines contain nicotine and some do not. Both types of medicines help with cravings, but the medicines that include nicotine help to relieve withdrawal symptoms. Your health care provider may recommend:  Nicotine patches, gum, or lozenges.  Nicotine inhalers or sprays.  Non-nicotine medicine that is taken by mouth. Talk with your health care provider about combining strategies, such as taking medicines while you are also receiving in-person counseling. Using these two strategies together makes you more  likely to succeed in quitting than if you used either strategy on its own. If you are pregnant or breastfeeding, talk with your health care provider about finding counseling or other support strategies to quit smoking. Do not take medicine to help you quit smoking unless told to do so by your health care  provider. What things can I do to make it easier to quit? Quitting smoking might feel overwhelming at first, but there is a lot that you can do to make it easier. Take these important actions:  Reach out to your family and friends and ask that they support and encourage you during this time. Call telephone quitlines, reach out to support groups, or work with a counselor for support.  Ask people who smoke to avoid smoking around you.  Avoid places that trigger you to smoke, such as bars, parties, or smoke-break areas at work.  Spend time around people who do not smoke.  Lessen stress in your life, because stress can be a smoking trigger for some people. To lessen stress, try:  Exercising regularly.  Deep-breathing exercises.  Yoga.  Meditating.  Performing a body scan. This involves closing your eyes, scanning your body from head to toe, and noticing which parts of your body are particularly tense. Purposefully relax the muscles in those areas.  Download or purchase mobile phone or tablet apps (applications) that can help you stick to your quit plan by providing reminders, tips, and encouragement. There are many free apps, such as QuitGuide from the [employer] developer(Centers for Disease Control and Prevention). You can find other support for quitting smoking (smoking cessation) through smokefree.gov and other websites. How will I feel when I quit smoking? Within the first 24 hours of quitting smoking, you may start to feel some withdrawal symptoms. These symptoms are usually most noticeable 2-3 days after quitting, but they usually do not last beyond 2-3 weeks. Changes or symptoms that you might experience include:  Mood swings.  Restlessness, anxiety, or irritation.  Difficulty concentrating.  Dizziness.  Strong cravings for sugary foods in addition to nicotine.  Mild weight gain.  Constipation.  Nausea.  Coughing or a sore throat.  Changes in how your medicines work in your  body.  A depressed mood.  Difficulty sleeping (insomnia). After the first 2-3 weeks of quitting, you may start to notice more positive results, such as:  Improved sense of smell and taste.  Decreased coughing and sore throat.  Slower heart rate.  Lower blood pressure.  Clearer skin.  The ability to breathe more easily.  Fewer sick days. Quitting smoking is very challenging for most people. Do not get discouraged if you are not successful the first time. Some people need to make many attempts to quit before they achieve long-term success. Do your best to stick to your quit plan, and talk with your health care provider if you have any questions or concerns. This information is not intended to replace advice given to you by your health care provider. Make sure you discuss any questions you have with your health care provider. Document Released: 07/27/2001 Document Revised: 03/30/2016 Document Reviewed: 12/17/2014 Elsevier Interactive Patient Education  2017 Elsevier Inc. Tobacco Use Disorder Tobacco use disorder (TUD) is a mental disorder. It is the long-term use of tobacco in spite of related health problems or difficulty with normal life activities. Tobacco is most commonly smoked as cigarettes and less commonly as cigars or pipes. Smokeless chewing tobacco and snuff are also popular. People with TUD get  a feeling of extreme pleasure (euphoria) from using tobacco and have a desire to use it again and again. Repeated use of tobacco can cause problems. The addictive effects of tobacco are due mainly tothe ingredient nicotine. Nicotine also causes a rush of adrenaline (epinephrine) in the body. This leads to increased blood pressure, heart rate, and breathing rate. These changes may cause problems for people with high blood pressure, weak hearts, or lung disease. High doses of nicotine in children and pets can lead to seizures and death. Tobacco contains a number of other unsafe chemicals.  These chemicals are especially harmful when inhaled as smoke and can damage almost every organ in the body. Smokers live shorter lives than nonsmokers and are at risk of dying from a number of diseases and cancers. Tobacco smoke can also cause health problems for nonsmokers (due to inhaling secondhand smoke). Smoking is also a fire hazard. TUD usually starts in the late teenage years and is most common in young adults between the ages of 71 and 25 years. People who start smoking earlier in life are more likely to continue smoking as adults. TUD is somewhat more common in men than women. People with TUD are at higher risk for using alcohol and other drugs of abuse. What increases the risk? Risk factors for TUD include:  Having family members with the disorder.  Being around people who use tobacco.  Having an existing mental health issue such as schizophrenia, depression, bipolar disorder, ADHD, or posttraumatic stress disorder (PTSD). What are the signs or symptoms? People with tobacco use disorder have two or more of the following signs and symptoms within 12 months:  Use of more tobacco over a longer period than intended.  Not able to cut down or control tobacco use.  A lot of time spent obtaining or using tobacco.  Strong desire or urge to use tobacco (craving). Cravings may last for 6 months or longer after quitting.  Use of tobacco even when use leads to major problems at work, school, or home.  Use of tobacco even when use leads to relationship problems.  Giving up or cutting down on important life activities because of tobacco use.  Repeatedly using tobacco in situations where it puts you or others in physical danger, like smoking in bed.  Use of tobacco even when it is known that a physical or mental problem is likely related to tobacco use.  Physical problems are numerous and may include chronic bronchitis, emphysema, lung and other cancers, gum disease, high blood pressure,  heart disease, and stroke.  Mental problems caused by tobacco may include difficulty sleeping and anxiety.  Need to use greater amounts of tobacco to get the same effect. This means you have developed a tolerance.  Withdrawal symptoms as a result of stopping or rapidly cutting back use. These symptoms may last a month or more after quitting and include the following:  Depressed, anxious, or irritable mood.  Difficulty concentrating.  Increased appetite.  Restlessness or trouble sleeping.  Use of tobacco to avoid withdrawal symptoms. How is this diagnosed? Tobacco use disorder is diagnosed by your health care provider. A diagnosis may be made by:  Your health care provider asking questions about your tobacco use and any problems it may be causing.  A physical exam.  Lab tests.  You may be referred to a mental health professional or addiction specialist. The severity of tobacco use disorder depends on the number of signs and symptoms you have:  Mild-Two or  three symptoms.  Moderate-Four or five symptoms.  Severe-Six or more symptoms. How is this treated? Many people with tobacco use disorder are unable to quit on their own and need help. Treatment options include the following:  Nicotine replacement therapy (NRT). NRT provides nicotine without the other harmful chemicals in tobacco. NRT gradually lowers the dosage of nicotine in the body and reduces withdrawal symptoms. NRT is available in over-the-counter forms (gum, lozenges, and skin patches) as well as prescription forms (mouth inhaler and nasal spray).  Medicines.This may include:  Antidepressant medicine that may reduce nicotine cravings.  A medicine that acts on nicotine receptors in the brain to reduce cravings and withdrawal symptoms. It may also block the effects of tobacco in people with TUD who relapse.  Counseling or talk therapy. A form of talk therapy called behavioral therapy is commonly used to treat  people with TUD. Behavioral therapy looks at triggers for tobacco use, how to avoid them, and how to cope with cravings. It is most effective in person or by phone but is also available in self-help forms (books and Internet websites).  Support groups. These provide emotional support, advice, and guidance for quitting tobacco. The most effective treatment for TUD is usually a combination of medicine, talk therapy, and support groups. Follow these instructions at home:  Keep all follow-up visits as directed by your health care provider. This is important.  Take medicines only as directed by your health care provider.  Check with your health care provider before starting new prescription or over-the-counter medicines. Contact a health care provider if:  You are not able to take your medicines as prescribed.  Treatment is not helping your TUD and your symptoms get worse. Get help right away if:  You have serious thoughts about hurting yourself or others.  You have trouble breathing, chest pain, sudden weakness, or sudden numbness in part of your body. This information is not intended to replace advice given to you by your health care provider. Make sure you discuss any questions you have with your health care provider. Document Released: 04/07/2004 Document Revised: 04/04/2016 Document Reviewed: 09/28/2013 Elsevier Interactive Patient Education  2017 [employer]

## 2016-07-20 NOTE — Assessment & Plan Note (Signed)
Patient has smoked for over 30 years at over 1 pack a day and is currently smoking. She has never had a CAT scan to rule out lung cancer in the past since she has been 56. We will order one. Also due to family history.

## 2016-07-20 NOTE — Assessment & Plan Note (Addendum)
Declines chantix- due to her over thinking that she has lung CA- she [facility] COPD testing and disucss her lungs with them.   - Encouraged also to talk to them about smoking cessation. Patient has failed Chantix and declines Buproprion or nicotine meds at this time.

## 2016-07-20 NOTE — Progress Notes (Signed)
2nd OV with me.    Impression and Recommendations:    1. Heavy smoker - current and greater than 30pk yr hx   2. SOB (shortness of breath) on exertion   3. Encounter for screening for lung cancer   4. Vitamin D insufficiency   5. Chronic fatigue   6. Class 1 drug-induced obesity in adult, unspecified BMI, unspecified whether serious comorbidity present   7. Tobacco abuse counseling   8. Smoker     Vitamin D insufficiency Taking supplements.   Encounter for screening for lung cancer Patient has smoked for over 30 years at over 1 pack a day and is currently smoking. She has never had a CAT scan to rule out lung cancer in the past since she has been 56. We will order one. Also due to family history.  Tobacco abuse counseling Declines chantix- due to her over thinking that she has lung CA- she wants to go to the [facility] for her COPD testing and disucss her lungs with them.   - Encouraged also to talk to them about smoking cessation. Patient has failed Chantix and declines Buproprion or nicotine meds at this time.  Fatigue - No exercise, likely deconditioning is playing a large role as well.  Heavy smoker -  >er 5 min counseling done: Advised nicotine patches since she unable to tolerate so many other meds.   Handouts provided.  Although I advised smoking cessation counseling, she declined smoking cess counselor/ specialist.    SOB (shortness of breath) on exertion Likely due to obese and deconditioned state however, patient has not had formal PFTs or any evaluation for COPD. Explained to patient she likely has damage in her lungs.   Instead of going for pulmonary function test as an outpatient, patient prefers to be seen by pulmonology and has several other questions for the specialist regarding lung cancer and her family history.  She desires consult as well      Orders Placed This Encounter  Procedures  . CT CHEST LUNG CANCER SCREENING LOW DOSE WO CONTRAST   Wt-168/ no needs/ bcbs/ ls and tyler [phone] w/ epic order    Standing Status:   Future    Standing Expiration Date:   09/20/2016    Order Specific Question:   Reason for Exam (SYMPTOM  OR DIAGNOSIS REQUIRED)    Answer:   Current smoker, greater than 30-pack-year history; family history lung cancer- screen for lung CA    Order Specific Question:   Is the patient pregnant?    Answer:   No    Order Specific Question:   Preferred Imaging Location?    Answer:   [id number]  . Ambulatory referral to Pulmonology    Referral Priority:   Routine    Referral Type:   Consultation    Referral Reason:   Specialty Services Required    Referred to Provider:   Oretha Milch, MD    Requested Specialty:   Pulmonary Disease    Number of Visits Requested:   1    The patient was counseled, risk factors were discussed, anticipatory guidance given.  Gross side effects, risk and benefits, and alternatives of medications discussed with patient.  Patient is aware that all medications have potential side effects and we are unable to predict every side effect or drug-drug interaction that may occur.  Expresses verbal understanding and consents to current therapy plan and treatment regimen.  New Prescriptions   No medications on  file    Modified Medications   No medications on file    Discontinued Medications   IBUPROFEN (ADVIL,MOTRIN) 200 MG TABLET    Take 200 mg by mouth every 6 (six) hours as needed for headache.     Please see AVS handed out to patient at the end of our visit for further patient instructions/ counseling done pertaining to today's office visit.    Return in about 6 weeks (around 08/31/2016) for Follow-up of current medical issues, and new concerns..  Note: This document was prepared using Dragon voice recognition software and may include unintentional dictation errors.   ______________________________________________________________________    Subjective:    HPI:  Leslie Romero is a 60 y.o. female who [facility] An acute care visit for shortness of breath.    CC: SOB  Can't breath anymore.  Losing my breath with activities she used not to.  Progressively W--> over the past 6-9 yrs - been slowly getting W.  Now hinders pt's QOL.      Prod cough all the time esp in am's.    *  Still smoking- 1ppd,  Since 60yo.        Never had eval for COPD or anything.       SIster, sister-in-law- lung ca  And father died of it 2 yrs ago.  Pt scared she may have it as well.   Wt Readings from Last 3 Encounters:  07/20/16 168 lb 11.2 oz (76.5 kg)  06/16/16 159 lb 3.2 oz (72.2 kg)  12/28/12 165 lb 3.2 oz (74.9 kg)   BP Readings from Last 3 Encounters:  07/20/16 (!) 145/84  06/16/16 (!) 142/80  03/02/14 155/87   Pulse Readings from Last 3 Encounters:  07/20/16 80  06/16/16 80  03/02/14 74   BMI Readings from Last 3 Encounters:  07/20/16 31.11 kg/m  06/16/16 29.35 kg/m  12/28/12 30.46 kg/m    Lab Results  Component Value Date   HGBA1C 5.5 06/23/2016     Patient Care Team    Relationship Specialty Notifications Start End  Thomasene Loteborah Lennon Boutwell, DO PCP - General Family Medicine  06/16/16      Active Clinical Problem List Patient Active Problem List   Diagnosis Date Noted  . Vitamin D insufficiency 07/20/2016  . Encounter for screening for lung cancer 07/20/2016  . SOB (shortness of breath) on exertion 07/20/2016  . 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 son w/ bipolar disorder 06/20/2016  . GAD (generalized anxiety disorder) 06/20/2016  . Adjustment disorder with mixed anxiety and depressed mood 06/20/2016  . Elevated blood pressure reading in office without diagnosis of hypertension 06/20/2016  . Recurrent knee pain- R  06/16/2016  . History of gastritis 06/16/2016  . Heavy smoker 06/16/2016  . Tobacco abuse  counseling 06/16/2016  . Fatigue 07/29/2008  . DIARRHEA-PRESUMED INFECTIOUS 07/10/2008  . Duodenitis determined by biopsy '09 06/07/2008  . GERD 06/03/2008  . DYSPHAGIA 06/03/2008  . ABDOMINAL PAIN-EPIGASTRIC 06/03/2008    Past Medical History: Past Medical History:  Diagnosis Date  . Allergy   . Depression   . Hypertension     Past Surgical History: Past Surgical History:  Procedure Laterality Date  . ECTOPIC PREGNANCY SURGERY     x 2  . EYE SURGERY    . WISDOM TOOTH EXTRACTION      Social History: Social History  Social History  . Marital status: Married    Spouse name: N/A  . Number of children: N/A  . Years of education: N/A   Social History Main Topics  . Smoking status: Current Every Day Smoker    Packs/day: 1.00    Years: 40.00    Types: Cigarettes  . Smokeless tobacco: None  . Alcohol use No  . Drug use: No  . Sexual activity: Yes    Birth control/ protection: None   Other Topics Concern  . None   Social History Narrative  . None    Family History: family history includes COPD in her mother; Cancer in her father; Diabetes in her father and sister; Hyperlipidemia in her father; Hypertension in her father; Stroke in her father; Thyroid disease in her mother and sister.  Allergies: Allergies  Allergen Reactions  . Moxifloxacin Rash  . Oxycodone-Acetaminophen     Other reaction(s): Hallucinations    Social History: History  Drug Use No  ,  History  Alcohol Use No  ,  History  Smoking Status  . Current Every Day Smoker  . Packs/day: 1.00  . Years: 40.00  . Types: Cigarettes  Smokeless Tobacco  . Not on file  ,  History  Sexual Activity  . Sexual activity: Yes  . Birth control/ protection: None    Outpatient Medications reportedly taken by patient at start of today's OV:  Current Meds  Medication Sig  . albuterol (PROVENTIL HFA;VENTOLIN HFA) 108 (90 BASE) MCG/ACT inhaler Inhale 2 puffs into the lungs every 4 (four) hours as  needed for wheezing (cough, shortness of breath or wheezing.).  Marland Kitchen. Vitamin D, Ergocalciferol, (DRISDOL) 50000 units CAPS capsule Take 1 capsule (50,000 Units total) by mouth every 7 (seven) days.   Current Facility-Administered Medications for the 07/20/16 encounter (Office Visit) with Thomasene Loteborah Asharia Lotter, DO  Medication  . ipratropium (ATROVENT) nebulizer solution 0.5 mg    Review of Systems  Constitutional: Negative for chills and fever.  HENT: Negative.  Negative for ear pain, hearing loss and tinnitus.   Eyes: Negative.  Negative for double vision and photophobia.  Respiratory: Positive for cough and shortness of breath. Negative for hemoptysis, sputum production and wheezing.   Cardiovascular: Negative.  Negative for chest pain and palpitations.  Gastrointestinal: Negative.  Negative for diarrhea, nausea and vomiting.  Genitourinary: Negative.   Musculoskeletal: Positive for joint pain and myalgias. Negative for back pain, falls and neck pain.  Skin: Negative for rash.  Neurological: Negative.  Negative for dizziness.  Endo/Heme/Allergies: Negative.   Psychiatric/Behavioral: Positive for depression. Negative for hallucinations, memory loss, substance abuse and suicidal ideas. The patient is not nervous/anxious and does not have insomnia.     Objective:  Blood pressure (!) 145/84, pulse 80, height 5' 1.75" (1.568 m), weight 168 lb 11.2 oz (76.5 kg), SpO2 96 %. Body mass index is 31.11 kg/m.  General: Well Developed, well nourished, and in no acute distress.  HEENT: Normocephalic, atraumatic Skin: Warm and dry, cap RF less 2 sec, good turgor Chest:  Globally diminished BS and increased exhalation phase but essentially clear to auscultation bilaterally and a\p.  No wh.   Normal excursion, shape, no gross abn Respiratory: +S1-S2, regular rhythm; speaking in full sentences, no conversational dyspnea NeuroM-Sk: Ambulates w/o assistance, moves * 4 Psych: A and O *3

## 2016-07-20 NOTE — Assessment & Plan Note (Signed)
Taking supplements

## 2016-07-23 ENCOUNTER — Other Ambulatory Visit: Payer: Self-pay | Admitting: Family Medicine

## 2016-07-26 ENCOUNTER — Other Ambulatory Visit: Payer: Self-pay

## 2016-07-26 ENCOUNTER — Telehealth: Payer: Self-pay | Admitting: Family Medicine

## 2016-07-26 NOTE — Telephone Encounter (Signed)
Danelle EarthlyNoel from [facility] called and said patient had wrong CPT added for CT Lung Cancer Screening and needs it updated to [id number]. She said if you have any questions her direct extension is [phone]

## 2016-07-26 NOTE — Telephone Encounter (Signed)
Leslie Romero states that they need insurance authorization for CPT code [phone], rather than [phone] (CT chest w/o).  Please advise her when this is obtained.  Thanks!

## 2016-07-27 ENCOUNTER — Ambulatory Visit: Payer: BLUE CROSS/BLUE SHIELD

## 2016-07-28 NOTE — Telephone Encounter (Addendum)
I resubmitted with the CPT code of G0297. It is now under review and they will fax the determination when available. Submitted for [facility] [address] [place], [place] [id number] Phone [phone] Fax [phone]. Not sure it will get approved.   Case # [id number]

## 2016-08-01 NOTE — Assessment & Plan Note (Signed)
Likely due to obese and deconditioned state however, patient has not had formal PFTs or any evaluation for COPD. Explained to patient she likely has damage in her lungs.   Instead of going for pulmonary function test as an outpatient, patient prefers to be seen by pulmonology and has several other questions for the specialist regarding lung cancer and her family history.  She desires consult as well

## 2016-08-01 NOTE — Assessment & Plan Note (Signed)
-   No exercise, likely deconditioning is playing a large role as well.

## 2016-08-01 NOTE — Assessment & Plan Note (Signed)
-  >  er 5 min counseling done: Advised nicotine patches since she unable to tolerate so many other meds.   Handouts provided.  Although I advised smoking cessation counseling, she declined smoking cess counselor/ specialist.

## 2016-08-26 ENCOUNTER — Encounter: Payer: Self-pay | Admitting: Family Medicine

## 2016-08-26 ENCOUNTER — Other Ambulatory Visit: Payer: Self-pay | Admitting: Family Medicine

## 2016-08-26 ENCOUNTER — Ambulatory Visit (INDEPENDENT_AMBULATORY_CARE_PROVIDER_SITE_OTHER): Payer: BLUE CROSS/BLUE SHIELD | Admitting: Family Medicine

## 2016-08-26 VITALS — BP 119/69 | HR 78 | Resp 18 | Wt 164.0 lb

## 2016-08-26 DIAGNOSIS — Z716 Tobacco abuse counseling: Secondary | ICD-10-CM

## 2016-08-26 DIAGNOSIS — M7701 Medial epicondylitis, right elbow: Secondary | ICD-10-CM | POA: Diagnosis not present

## 2016-08-26 DIAGNOSIS — F17200 Nicotine dependence, unspecified, uncomplicated: Secondary | ICD-10-CM | POA: Diagnosis not present

## 2016-08-26 DIAGNOSIS — Z23 Encounter for immunization: Secondary | ICD-10-CM

## 2016-08-26 DIAGNOSIS — R030 Elevated blood-pressure reading, without diagnosis of hypertension: Secondary | ICD-10-CM | POA: Insufficient documentation

## 2016-08-26 NOTE — Patient Instructions (Addendum)
For her epicondylitis please ice for 15-20 minutes 3-4 times a day. You must change how you're doing things and modify your activities so you don't aggravate that forearm. Use the brace consistently and day and night until you have no more pain. If it does not improve we can always send her to physical therapy which would be the next step in treatment. - Please see the handouts I gave you for further information once it stops hurting then you can even do the rehabilitation exercises that I gave you in one of the handouts.    Told pt to think seriously about quitting smoking!  Told pt it is very important for his/her health and well being.   --> vaping vs patches-->  pick quit date and then stick to it.    Smoking cessation instruction/counseling given:  counseled patient on the dangers of tobacco use, advised patient to stop smoking, and reviewed strategies to maximize success  Discussed with patient that there are multiple treatments to aid in quitting smoking, however I explained none will work unless pt really want to quit  Told to call 1-800-QUIT-NOW 424-817-2943(1-(828) 183-3637-669) for free smoking cessation counseling and support, or pt can go online to www.heart.org - the American Heart Association website and search "quit smoking ".

## 2016-08-26 NOTE — Progress Notes (Signed)
Impression and Recommendations:    1. Medial epicondylitis of elbow, right   2. Heavy smoker   3. Tobacco abuse counseling   4. Need for diphtheria-tetanus-pertussis (Tdap) vaccine     Heavy smoker Discussed with patient many treatment options including but not limited to Chantix, vaping, patches, Wellbutrin etc.  She will do patches or vaping.-->  pick quit date and then stick to it.    Told pt to think seriously about quitting smoking!  Told pt it is very important for his/her health and well being.    Smoking cessation instruction/counseling given:  counseled patient on the dangers of tobacco use, advised patient to stop smoking, and reviewed strategies to maximize success  Discussed with patient that there are multiple treatments to aid in quitting smoking, however I explained none will work unless pt really want to quit  Told to call 1-800-QUIT-NOW 564-579-1961(1-(435)809-2912-669) for free smoking cessation counseling and support, or pt can go online to www.heart.org - the American Heart Association website and search "quit smoking ".    Medial epicondylitis of elbow, right For her epicondylitis please ice for 15-20 minutes 3-4 times a day. You must change how you're doing things and modify your activities so you don't aggravate that forearm. Use the brace consistently and day and night until you have no more pain. If it does not improve we can always send her to physical therapy which would be the next step in treatment. - Please see the handouts I gave you for further information once it stops hurting then you can even do the rehabilitation exercises that I gave you in one of the handouts.      Orders Placed This Encounter  Procedures  . Tdap vaccine greater than or equal to 7yo IM     New Prescriptions   No medications on file    Modified Medications   No medications on file    Discontinued Medications   No medications on file    The patient was counseled, risk  factors were discussed, anticipatory guidance given.  Gross side effects, risk and benefits, and alternatives of medications and treatment plan in general discussed with patient.  Patient is aware that all medications have potential side effects and we are unable to predict every side effect or drug-drug interaction that may occur.   Patient will call with any questions prior to using medication if they have concerns.  Expresses verbal understanding and consents to current therapy and treatment regimen.  No barriers to understanding were identified.  Red flag symptoms and signs discussed in detail.  Patient expressed understanding regarding what to do in case of emergency\urgent symptoms  Return for Follow-up as needed for your knee near future. Otherwise I'll see her in 2-3 mo.  Please see AVS handed out to patient at the end of our visit for further patient instructions/ counseling done pertaining to today's office visit.    Note: This document was prepared using Dragon voice recognition software and may include unintentional dictation errors.   --------------------------------------------------------------------------------------------------------------------------------------------------------------------------------------------------------------------------------------------    Subjective:    CC:  Chief Complaint  Patient presents with  . Nicotine Dependence  . Gastroesophageal Reflux  . Vitamin D def    HPI: Leslie Romero is a 61 y.o. female who [facility] for issues as discussed below.   R arm:   Started 1 mo ago.  No injury she knows of.   Pain in elbow  and down the palmar aspect FA- sometimes beyond wrist.  Toothache.   Has not hurt in the past- excpt for carpal tunnel R wrist.   Lifting and pouring milk- kills it--> int rotation of shoulder / pronation of the wrist.   Rubbing pain cream on it.  Wakes up in the am--> sleeps on it.      Planning on quitting smoking b-4 61th birthday.   Has used patch to quit in the past- 1 month quit then stress brought her back to smoking.  Tried wellbutrin yrs ago--> gained a lot of wt.  Tried chantix - 1 tab one time- felt panicky and strange--> stopped it.    Wt Readings from Last 3 Encounters:  08/26/16 164 lb (74.4 kg)  07/20/16 168 lb 11.2 oz (76.5 kg)  06/16/16 159 lb 3.2 oz (72.2 kg)   BP Readings from Last 3 Encounters:  08/26/16 119/69  07/20/16 (!) 145/84  06/16/16 (!) 142/80   Pulse Readings from Last 3 Encounters:  08/26/16 78  07/20/16 80  06/16/16 80   BMI Readings from Last 3 Encounters:  08/26/16 30.24 kg/m  07/20/16 31.11 kg/m  06/16/16 29.35 kg/m     Patient Care Team    Relationship Specialty Notifications Start End  Thomasene Lot, DO PCP - General Family Medicine  06/16/16     Patient Active Problem List   Diagnosis Date Noted  . Medial epicondylitis of elbow, right 08/27/2016  . Vitamin D insufficiency 07/20/2016  . Encounter for screening for lung cancer 07/20/2016  . SOB (shortness of breath) on exertion 07/20/2016  . 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 son w/ bipolar disorder 06/20/2016  . GAD (generalized anxiety disorder) 06/20/2016  . Adjustment disorder with mixed anxiety and depressed mood 06/20/2016  . Elevated blood pressure reading in office without diagnosis of hypertension 06/20/2016  . Recurrent knee pain- R  06/16/2016  . History of gastritis 06/16/2016  . Heavy smoker 06/16/2016  . Tobacco abuse counseling 06/16/2016  . Fatigue 07/29/2008  . DIARRHEA-PRESUMED INFECTIOUS 07/10/2008  . Duodenitis determined by biopsy '09 06/07/2008  . GERD 06/03/2008  . DYSPHAGIA 06/03/2008  . ABDOMINAL PAIN-EPIGASTRIC 06/03/2008    Past Medical history, Surgical history, Family history, Social history, Allergies and Medications have been entered  into the medical record, reviewed and changed as needed.   Allergies:  Allergies  Allergen Reactions  . Moxifloxacin Rash  . Oxycodone-Acetaminophen     Other reaction(s): Hallucinations    Review of Systems  Constitutional: Negative for diaphoresis and weight loss.  HENT: Negative for nosebleeds.   Eyes: Negative for blurred vision and double vision.  Respiratory: Negative for shortness of breath and wheezing.   Cardiovascular: Negative for chest pain, palpitations, orthopnea and claudication.  Gastrointestinal: Negative for diarrhea, nausea and vomiting.  Musculoskeletal: Positive for joint pain and myalgias. Negative for falls and neck pain.  Skin: Negative for rash.  Neurological: Negative for dizziness and focal weakness.  Endo/Heme/Allergies: Negative for polydipsia.  Psychiatric/Behavioral: Negative for memory loss.     Objective:   Blood pressure 119/69, pulse 78, resp. rate 18, weight 164 lb (74.4 kg), SpO2 96 %. Body mass index is 30.24 kg/m. General: Well Developed, well nourished, appropriate for stated age.  Neuro: Alert and oriented x3, extra-ocular muscles intact, sensation grossly intact.  HEENT: Normocephalic, atraumatic, neck supple, no carotid bruits appreciated  Skin: no gross rash. Cardiac: RRR, S1 S2  Respiratory: ECTA B/L, Not using accessory muscles, speaking in full sentences-unlabored. Vascular:  Ext warm, dry, pink; cap RF less 2 sec. Psych: No HI/SI, judgement and insight good, Euthymic mood. Full Affect. Elbow: Unremarkable to inspection. Range of motion full pronation, supination, flexion, extension. Strength is full to all of the above directions Stable to varus, valgus stress. Negative moving valgus stress test. No discrete areas of tenderness to palpationExcept for 1-1/2-below the medial epicondyle. Ulnar nerve does not sublux. Negative cubital tunnel Tinel's.

## 2016-08-26 NOTE — Assessment & Plan Note (Addendum)
Discussed with patient many treatment options including but not limited to Chantix, vaping, patches, Wellbutrin etc.  She will do patches or vaping.-->  pick quit date and then stick to it.    Told pt to think seriously about quitting smoking!  Told pt it is very important for his/her health and well being.    Smoking cessation instruction/counseling given:  counseled patient on the dangers of tobacco use, advised patient to stop smoking, and reviewed strategies to maximize success  Discussed with patient that there are multiple treatments to aid in quitting smoking, however I explained none will work unless pt really want to quit  Told to call 1-800-QUIT-NOW 782-377-4537(1-681-725-0306-669) for free smoking cessation counseling and support, or pt can go online to www.heart.org - the American Heart Association website and search "quit smoking ".

## 2016-08-27 ENCOUNTER — Other Ambulatory Visit: Payer: Self-pay | Admitting: Family Medicine

## 2016-08-27 DIAGNOSIS — M7701 Medial epicondylitis, right elbow: Secondary | ICD-10-CM | POA: Insufficient documentation

## 2016-08-27 DIAGNOSIS — F17200 Nicotine dependence, unspecified, uncomplicated: Secondary | ICD-10-CM

## 2016-08-27 DIAGNOSIS — R5382 Chronic fatigue, unspecified: Secondary | ICD-10-CM

## 2016-08-27 DIAGNOSIS — Z122 Encounter for screening for malignant neoplasm of respiratory organs: Secondary | ICD-10-CM

## 2016-08-27 DIAGNOSIS — Z716 Tobacco abuse counseling: Secondary | ICD-10-CM

## 2016-08-27 NOTE — Assessment & Plan Note (Signed)
For her epicondylitis please ice for 15-20 minutes 3-4 times a day. You must change how you're doing things and modify your activities so you don't aggravate that forearm. Use the brace consistently and day and night until you have no more pain. If it does not improve we can always send her to physical therapy which would be the next step in treatment. - Please see the handouts I gave you for further information once it stops hurting then you can even do the rehabilitation exercises that I gave you in one of the handouts.

## 2016-08-30 ENCOUNTER — Ambulatory Visit
Admission: RE | Admit: 2016-08-30 | Discharge: 2016-08-30 | Disposition: A | Discharge status: Home / Self Care | Payer: BLUE CROSS/BLUE SHIELD | Source: Ambulatory Visit | Attending: Family Medicine | Admitting: Family Medicine

## 2016-08-30 DIAGNOSIS — R5382 Chronic fatigue, unspecified: Secondary | ICD-10-CM

## 2016-08-30 DIAGNOSIS — Z716 Tobacco abuse counseling: Secondary | ICD-10-CM

## 2016-08-30 DIAGNOSIS — Z122 Encounter for screening for malignant neoplasm of respiratory organs: Secondary | ICD-10-CM

## 2016-08-30 DIAGNOSIS — F17200 Nicotine dependence, unspecified, uncomplicated: Secondary | ICD-10-CM

## 2016-09-01 ENCOUNTER — Institutional Professional Consult (permissible substitution): Payer: BLUE CROSS/BLUE SHIELD | Admitting: Pulmonary Disease

## 2016-09-03 ENCOUNTER — Other Ambulatory Visit: Payer: Self-pay

## 2016-09-03 DIAGNOSIS — R911 Solitary pulmonary nodule: Secondary | ICD-10-CM

## 2016-09-03 DIAGNOSIS — N63 Unspecified lump in unspecified breast: Secondary | ICD-10-CM

## 2016-09-07 ENCOUNTER — Other Ambulatory Visit: Payer: Self-pay

## 2016-09-07 DIAGNOSIS — N63 Unspecified lump in unspecified breast: Secondary | ICD-10-CM

## 2016-09-07 NOTE — Progress Notes (Signed)
Previous order for CT entered incorrectly as screening and should have been follow up re: breast nodule.   New order placed.  Tiajuana Amass. Yvett Rossel, CMA

## 2016-09-09 ENCOUNTER — Ambulatory Visit: Payer: BLUE CROSS/BLUE SHIELD

## 2016-09-09 ENCOUNTER — Ambulatory Visit (INDEPENDENT_AMBULATORY_CARE_PROVIDER_SITE_OTHER): Payer: BLUE CROSS/BLUE SHIELD | Admitting: Adult Health

## 2016-09-09 ENCOUNTER — Encounter: Payer: Self-pay | Admitting: Adult Health

## 2016-09-09 VITALS — BP 154/91 | HR 87 | Ht 61.75 in | Wt 164.9 lb

## 2016-09-09 DIAGNOSIS — M1711 Unilateral primary osteoarthritis, right knee: Secondary | ICD-10-CM | POA: Diagnosis not present

## 2016-09-09 DIAGNOSIS — G8929 Other chronic pain: Secondary | ICD-10-CM

## 2016-09-09 DIAGNOSIS — M25561 Pain in right knee: Secondary | ICD-10-CM | POA: Diagnosis not present

## 2016-09-09 DIAGNOSIS — M25562 Pain in left knee: Secondary | ICD-10-CM | POA: Diagnosis not present

## 2016-09-09 MED ORDER — DICLOFENAC SODIUM 1 % TD GEL: 1.0000 "application " | Freq: Four times a day (QID) | TRANSDERMAL | 2 refills | Status: DC

## 2016-09-09 NOTE — Patient Instructions (Addendum)
Arthritis Introduction Arthritis means joint pain. It can also mean joint disease. A joint is a place where bones come together. People who have arthritis may have:  Red joints.  Swollen joints.  Stiff joints.  Warm joints.  A fever.  A feeling of being sick. Follow these instructions at home: Pay attention to any changes in your symptoms. Take these actions to help with your pain and swelling. Medicines  Take over-the-counter and prescription medicines only as told by your doctor.  Do not take aspirin for pain if your doctor says that you may have gout. Activity  Rest your joint if your doctor tells you to.  Avoid activities that make the pain worse.  Exercise your joint regularly as told by your doctor. Try doing exercises like:  Swimming.  Water aerobics.  Biking.  Walking. Joint Care   If your joint is swollen, keep it raised (elevated) if told by your doctor.  If your joint feels stiff in the morning, try taking a warm shower.  If you have diabetes, do not apply heat without asking your doctor.  If told, apply heat to the joint:  Put a towel between the joint and the hot pack or heating pad.  Leave the heat on the area for 20-30 minutes.  If told, apply ice to the joint:  Put ice in a plastic bag.  Place a towel between your skin and the bag.  Leave the ice on for 20 minutes, 2-3 times per day.  Keep all follow-up visits as told by your doctor. Contact a doctor if:  The pain gets worse.  You have a fever. Get help right away if:  You have very bad pain in your joint.  You have swelling in your joint.  Your joint is red.  Many joints become painful and swollen.  You have very bad back pain.  Your leg is very weak.  You cannot control your pee (urine) or poop (stool). This information is not intended to replace advice given to you by your health care provider. Make sure you discuss any questions you have with your health care  provider. Document Released: 10/27/2009 Document Revised: 01/08/2016 Document Reviewed: 10/28/2014  2017 Elsevier   Discussed results of xray.  Orthopedic Specialist referral placed. Use Diclofenac cream as directed. Ice knee for 20 mins several times daily. Acetaminophen per manufacturer's directions.

## 2016-09-09 NOTE — Progress Notes (Signed)
Subjective:    Patient ID: Leslie Romero, female    DOB: 08/09/1956, 61 y.o.   MRN: [id number]  HPI:  Leslie Romero presents for worsening right knee pain that initially began at age 61.  She reports pain is currently 1/10 at rest, localized over patella.  She reports that the pain can increase to 10/10 with prolonged stranding/wallking/bending.  She cannot use NSAIDs (GI contraindications) and intermittent use of Acetaminophen only provides minimal relief.  She also has chronic left knee pain-none at present.  Her right hip also aches.  She is able to ambulate, however with difficulty and she feels "that my knee is shaky, I fell like it will give way sometimes".   Patient Care Team    Relationship Specialty Notifications Start End  Thomasene Loteborah Opalski, DO PCP - General Family Medicine  06/16/16     Patient Active Problem List   Diagnosis Date Noted  . Medial epicondylitis of elbow, right 08/27/2016  . Vitamin D insufficiency 07/20/2016  . Encounter for screening for lung cancer 07/20/2016  . SOB (shortness of breath) on exertion 07/20/2016  . 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 son w/ bipolar disorder 06/20/2016  . GAD (generalized anxiety disorder) 06/20/2016  . Adjustment disorder with mixed anxiety and depressed mood 06/20/2016  . Elevated blood pressure reading in office without diagnosis of hypertension 06/20/2016  . Recurrent knee pain- R  06/16/2016  . History of gastritis 06/16/2016  . Heavy smoker 06/16/2016  . Tobacco abuse counseling 06/16/2016  . Fatigue 07/29/2008  . DIARRHEA-PRESUMED INFECTIOUS 07/10/2008  . Duodenitis determined by biopsy '09 06/07/2008  . GERD 06/03/2008  . DYSPHAGIA 06/03/2008  . ABDOMINAL PAIN-EPIGASTRIC 06/03/2008     Past Medical History:  Diagnosis Date  . Allergy   . Depression   . Hypertension      Past Surgical History:  Procedure Laterality Date  .  ECTOPIC PREGNANCY SURGERY     x 2  . EYE SURGERY    . WISDOM TOOTH EXTRACTION       Family History  Problem Relation Age of Onset  . Thyroid disease Mother   . COPD Mother   . Cancer Father     lung  . Diabetes Father   . Hyperlipidemia Father   . Hypertension Father   . Stroke Father   . Thyroid disease Sister   . Diabetes Sister      History  Drug Use No     History  Alcohol Use No     History  Smoking Status  . Current Every Day Smoker  . Packs/day: 1.00  . Years: 40.00  . Types: Cigarettes  Smokeless Tobacco  . Never Used     Outpatient Encounter Prescriptions as of 09/09/2016  Medication Sig Note  . albuterol (PROVENTIL HFA;VENTOLIN HFA) 108 (90 BASE) MCG/ACT inhaler Inhale 2 puffs into the lungs every 4 (four) hours as needed for wheezing (cough, shortness of breath or wheezing.).   Marland Kitchen. Cholecalciferol (VITAMIN D-3) 5000 units TABS Take 1 tablet by mouth daily.   . magnesium 30 MG tablet Take 30 mg by mouth 2 (two) times daily.   . Multiple Vitamins-Minerals (MULTIVITAMIN WITH MINERALS) tablet Take 1 tablet by mouth daily.   Marland Kitchen. omeprazole (PRILOSEC) 40 MG capsule Take 1 capsule by mouth daily. 06/16/2016: Received from: [facility] Received Sig: Take one capsule (40 mg total) by mouth daily.  . Vitamin  D, Ergocalciferol, (DRISDOL) 50000 units CAPS capsule Take 1 capsule (50,000 Units total) by mouth every 7 (seven) days.   . Zinc 100 MG TABS Take by mouth.   . [DISCONTINUED] cholecalciferol (VITAMIN D) 1000 units tablet Take 5,000 Units by mouth daily.   . diclofenac sodium (VOLTAREN) 1 % GEL Apply 1 application topically 4 (four) times daily.   . [DISCONTINUED] amoxicillin-clavulanate (AUGMENTIN) 875-125 MG tablet  08/26/2016: Received from: External Pharmacy   Facility-Administered Encounter Medications as of 09/09/2016  Medication  . ipratropium (ATROVENT) nebulizer solution 0.5 mg    Allergies: Moxifloxacin and Oxycodone-acetaminophen  Body mass  index is 30.41 kg/m.  Blood pressure (!) 154/91, pulse 87, height 5' 1.75" (1.568 m), weight 164 lb 14.4 oz (74.8 kg).     Review of Systems  Constitutional: Positive for activity change. Negative for diaphoresis, fatigue and unexpected weight change.  HENT: Negative for congestion.   Eyes: Negative for visual disturbance.  Respiratory: Negative for shortness of breath.   Cardiovascular: Negative for chest pain, palpitations and leg swelling.  Musculoskeletal: Positive for arthralgias, gait problem, joint swelling and myalgias.  Psychiatric/Behavioral: The patient is not nervous/anxious.        Objective:   Physical Exam  Constitutional: She appears well-developed and well-nourished. No distress.  Cardiovascular: Normal rate, regular rhythm and normal heart sounds.   Pulmonary/Chest: No respiratory distress. She has no wheezes. She has no rales.  Musculoskeletal: She exhibits edema and tenderness.       Right knee: She exhibits no swelling, no effusion, no deformity, normal alignment, no LCL laxity, normal patellar mobility, no bony tenderness, normal meniscus and no MCL laxity. No tenderness found.       Left knee: She exhibits swelling. She exhibits no effusion, normal alignment, no LCL laxity, normal patellar mobility, no bony tenderness, normal meniscus and no MCL laxity. Tenderness found. Patellar tendon tenderness noted.  Skin: She is not diaphoretic.          Assessment & Plan:   1. Osteoarthritis of right knee, unspecified osteoarthritis type   2. Chronic pain of both knees   3. Recurrent pain of right knee     Recurrent knee pain- R  Xray of right knee completed. Medications and care instructions given. Orthopedic specialist referral placed.     FOLLOW-UP:   As needed.

## 2016-09-09 NOTE — Assessment & Plan Note (Signed)
Xray of right knee completed. Medications and care instructions given. Orthopedic specialist referral placed.

## 2016-09-21 ENCOUNTER — Ambulatory Visit (INDEPENDENT_AMBULATORY_CARE_PROVIDER_SITE_OTHER): Payer: BLUE CROSS/BLUE SHIELD | Admitting: Internal Medicine

## 2016-09-21 ENCOUNTER — Encounter: Payer: Self-pay | Admitting: Internal Medicine

## 2016-09-21 VITALS — BP 140/82 | HR 77 | Ht 61.5 in | Wt 165.0 lb

## 2016-09-21 DIAGNOSIS — R0602 Shortness of breath: Secondary | ICD-10-CM | POA: Diagnosis not present

## 2016-09-21 DIAGNOSIS — R918 Other nonspecific abnormal finding of lung field: Secondary | ICD-10-CM | POA: Diagnosis not present

## 2016-09-21 DIAGNOSIS — J449 Chronic obstructive pulmonary disease, unspecified: Secondary | ICD-10-CM | POA: Diagnosis not present

## 2016-09-21 DIAGNOSIS — F17210 Nicotine dependence, cigarettes, uncomplicated: Secondary | ICD-10-CM | POA: Diagnosis not present

## 2016-09-21 MED ORDER — GLYCOPYRROLATE-FORMOTEROL 9-4.8 MCG/ACT IN AERO: 2.0000 | INHALATION_SPRAY | Freq: Two times a day (BID) | RESPIRATORY_TRACT | 11 refills | Status: DC

## 2016-09-21 MED ORDER — GLYCOPYRROLATE-FORMOTEROL 9-4.8 MCG/ACT IN AERO: 2.0000 | INHALATION_SPRAY | Freq: Two times a day (BID) | RESPIRATORY_TRACT | 0 refills | Status: DC

## 2016-09-21 NOTE — Patient Instructions (Addendum)
The key is to stop smoking completely before smoking completely stops you - it's not too late  Plan A = Automatic = Bevespi Take 2 puffs first thing in am and then another 2 puffs about 12 hours later.    Plan B = Backup Only use your albuterol as a rescue medication to be used if you can't catch your breath by resting or doing a relaxed purse lip breathing pattern.  - The less you use it, the better it will work when you need it. - Ok to use the inhaler up to 2 puffs  every 4 hours if you must but call for appointment if use goes up over your usual need - Don't leave home without it !!  (think of it like the spare tire for your car)     Return about 11/29/16 with CT and pfts

## 2016-09-21 NOTE — Progress Notes (Signed)
Subjective:     Patient ID: Elease EtienneBrenda E Schepp, female   DOB: 08/16/1955,     MRN: [id number]  HPI   [age] yowf active smoker with tendency to coughing fits started in late elementary school and continued as adult but pattern has been sporadic and then  Noted indolent onset slowly progressive  doe x 2012 x fast walk  With MPNS on lung cancer screening study referred to pulmonary clinic 09/21/2016 by Dr   Sharee Holsterpalski    09/21/2016 1st [facility] office visit/ Taegan Standage   Chief Complaint  Patient presents with  . Pulmonary Consult    Referred by Dr. Sharee Holsterpalski.  Pt c/o "stabbing CP" at night when she lies down. She has noticed this over the past 6 months. She states she has been having SOB for the past 3 yrs "seems like I am SOB alot of the time".    cp's are migratory/ worse with coughing/  am phlegm x tsp mucoid / never bloody. Doe = MMRC1 = can walk nl pace, flat grade, can't hurry or go uphills or steps s sob     No obvious day to day or daytime variability or assoc  purulent sputum or mucus plugs or hemoptysis  or chest tightness, subjective wheeze or overt sinus or hb symptoms. No unusual exp hx or h/o childhood pna/ asthma or knowledge of premature birth.  Sleeping ok without nocturnal  or early am exacerbation  of respiratory  c/o's or need for noct saba. Also denies any obvious fluctuation of symptoms with weather or environmental changes or other aggravating or alleviating factors except as outlined above   Current Medications, Allergies, Complete Past Medical History, Past Surgical History, Family History, and Social History were reviewed in [facility] electronic medical record.  ROS  The following are not active complaints unless bolded sore throat, dysphagia, dental problems, itching, sneezing,  nasal congestion or excess/ purulent secretions, ear ache,   fever, chills, sweats, unintended wt loss, classically pleuritic or exertional cp,  orthopnea pnd or leg swelling, presyncope,  palpitations, abdominal pain, anorexia, nausea, vomiting, diarrhea  or change in bowel or bladder habits, change in stools or urine, dysuria,hematuria,  rash, arthralgias, visual complaints, headache, numbness, weakness or ataxia or problems with walking or coordination,  change in mood/affect or memory.           Review of Systems     Objective:   Physical Exam    anxious mod obese wf nad  Wt Readings from Last 3 Encounters:  09/21/16 165 lb (74.8 kg)  09/09/16 164 lb 14.4 oz (74.8 kg)  08/26/16 164 lb (74.4 kg)    Vital signs reviewed - Note on arrival 02 sats  97% on RA     HEENT: nl dentition, turbinates, and oropharynx. Nl external ear canals without cough reflex   NECK :  without JVD/Nodes/TM/ nl carotid upstrokes bilaterally   LUNGS: no acc muscle use,  Nl contour chest which is clear to A and P bilaterally without cough on insp or exp maneuvers   CV:  RRR  no s3 or murmur or increase in P2, nad no edema   ABD:  soft and nontender with nl inspiratory excursion in the supine position. No bruits or organomegaly appreciated, bowel sounds nl  MS:  Nl gait/ ext warm without deformities, calf tenderness, cyanosis or clubbing No obvious joint restrictions   SKIN: warm and dry without lesions    NEURO:  alert, approp, nl sensorium with  no motor  or cerebellar deficits apparent.       I personally reviewed images and agree with radiology impression as follows:  CT low dose screening Chest  08/30/16  Lung-RADS Category 4A, suspicious. Follow up low-dose chest CT without contrast in 3 months (please use the following order, "CT CHEST LCS NODULE FOLLOW-UP W/O CM") is recommended. Alternatively, PET may be considered when there is a solid component 8mm or larger. A posterior right upper lobe opacity is favored to represent an area of scarring, given relatively flat morphology on reformatted images. However, neoplasm cannot be excluded.    Assessment:

## 2016-09-22 NOTE — Assessment & Plan Note (Signed)
Spirometry 09/21/2016  FEV1 1.02 (43%)  Ratio 52 s previous rx -  09/21/2016  After extensive coaching HFA effectiveness =      > try bevespi 2bid     Initial pfts don't match MMRC 1 and I suspect she's more limited than she admits and worth trying lama/laba as initial rx for Group B -pattern symptoms and then return for full pfts and in meantime work on stopping smoking (see separate a/p)

## 2016-09-22 NOTE — Assessment & Plan Note (Signed)

## 2016-09-22 NOTE — Assessment & Plan Note (Signed)
Assoc with migratory cp's of unclear etiology but since they are migratory and chronic do not suggest anything more than mscp from cough or IBS with transmitted gas pains/ advised

## 2016-09-22 NOTE — Assessment & Plan Note (Signed)
CT low dose screening Chest  08/30/16  Lung-RADS Category 4A, suspicious. Follow up low-dose chest CT without contrast in 3 months (please use the following order, "CT CHEST LCS NODULE FOLLOW-UP W/O CM") is recommended. Alternatively, PET may be considered when there is a solid component 8mm or larger. A posterior right upper lobe opacity is favored to represent an area of scarring, given relatively flat morphology on reformatted images. However, neoplasm cannot be excluded.  - Repeat CT 11/29/16 >>>  Although there are clearly abnormalities on CT scan, they should probably be considered "microscopic" since not obvious on plain cxr .     In the setting of obvious "macroscopic" health issues,  I am very reluctatnt to embark on an invasive w/u at this point but will arrange consevative  follow up and in the meantime see what we can do to address the patient's subjective concerns.    Discussed in detail all the  indications, usual  risks and alternatives  relative to the benefits with patient who agrees to proceed with conservative f/u as outlined

## 2016-09-23 ENCOUNTER — Ambulatory Visit: Payer: BLUE CROSS/BLUE SHIELD | Admitting: Family Medicine

## 2016-09-28 LAB — HM MAMMOGRAPHY

## 2016-09-28 LAB — CHG US, BREAST(S), REAL TIME

## 2016-09-29 ENCOUNTER — Ambulatory Visit (INDEPENDENT_AMBULATORY_CARE_PROVIDER_SITE_OTHER): Payer: BLUE CROSS/BLUE SHIELD | Admitting: Family Medicine

## 2016-09-29 ENCOUNTER — Encounter: Payer: Self-pay | Admitting: Family Medicine

## 2016-09-29 DIAGNOSIS — M1711 Unilateral primary osteoarthritis, right knee: Secondary | ICD-10-CM | POA: Insufficient documentation

## 2016-09-29 MED ORDER — DICLOFENAC SODIUM 2 % TD SOLN: 2.0000 "application " | Freq: Two times a day (BID) | TRANSDERMAL | 3 refills | Status: DC

## 2016-09-29 NOTE — Progress Notes (Signed)
Tawana ScaleZach Mitzy Naron D.O. [facility] [address] [place], [place] [address] Phone: [phone] Subjective:    I'm seeing this patient by the request  of:  Thomasene Loteborah Opalski, DO Laurance FlattenKaty Bess NP  CC: Right knee pain  BJY:NWGNFAOZHYHPI:Subjective  Leslie EtienneBrenda E Romero is a 61 y.o. female coming in with complaint of right knee pain. Patient states that this is a. Seems to be on the anterior aspect of the knee. Worse with any time she stays in one position too long including standing walking or bending. Patient has to avoid anti-inflammatories as Tylenol and mild improvement in pain. Patient states that she is able to do most daily activities but sometimes feels Knee is unstable. Patient is also having now more of a right hip pain. Patient includes because it is severe degenerative knee pain as well. States that sometimes it can be swelling on the anterior aspect the knee. Worse with going upstairs. Denies any significant radiation the pain.   patient did have x-rays of the right knee taken 09/09/2016. These were independently visualized by me. No significant bony abnormality with very mild arthritic changes .  Past Medical History:  Diagnosis Date  . Allergy   . Depression   . Hypertension    Past Surgical History:  Procedure Laterality Date  . ECTOPIC PREGNANCY SURGERY     x 2  . EYE SURGERY    . WISDOM TOOTH EXTRACTION     Social History   Social History  . Marital status: Married    Spouse name: N/A  . Number of children: N/A  . Years of education: N/A   Social History Main Topics  . Smoking status: Current Every Day Smoker    Packs/day: 1.00    Years: 40.00    Types: Cigarettes  . Smokeless tobacco: Never Used  . Alcohol use No  . Drug use: No  . Sexual activity: Yes    Birth control/ protection: None   Other Topics Concern  . Not on file   Social History Narrative  . No narrative on file   Allergies  Allergen Reactions  . Moxifloxacin Rash  . Oxycodone-Acetaminophen    Other reaction(s): Hallucinations   Family History  Problem Relation Age of Onset  . Thyroid disease Mother   . COPD Mother   . Cancer Father     lung  . Diabetes Father   . Hyperlipidemia Father   . Hypertension Father   . Stroke Father   . Thyroid disease Sister   . Diabetes Sister     Past medical history, social, surgical and family history all reviewed in electronic medical record.  No pertanent information unless stated regarding to the chief complaint.   Review of Systems:Review of systems updated and as accurate as of 09/29/16  No headache, visual changes, nausea, vomiting, diarrhea, constipation, dizziness, abdominal pain, skin rash, fevers, chills, night sweats, weight loss, swollen lymph nodes, body aches, joint swelling, muscle aches, chest pain, shortness of breath, mood changes.   Objective  There were no vitals taken for this visit. Systems examined below as of 09/29/16   General: No apparent distress alert and oriented x3 mood and affect normal, dressed appropriately.  HEENT: Pupils equal, extraocular movements intact  Respiratory: Patient's speak in full sentences and does not appear short of breath  Cardiovascular: No lower extremity edema, non tender, no erythema  Skin: Warm dry intact with no signs of infection or rash on extremities or on axial skeleton.  Abdomen: Soft nontender  Neuro: Cranial nerves II through XII are intact, neurovascularly intact in all extremities with 2+ DTRs and 2+ pulses.  Lymph: No lymphadenopathy of posterior or anterior cervical chain or axillae bilaterally.  Gait normal with good balance and coordination.  MSK:  Non tender with full range of motion and good stability and symmetric strength and tone of shoulders, elbows, wrist,  and ankles bilaterally.  Knee:right  Normal to inspection with no erythema or effusion or obvious bony abnormalities. Palpation normal with no warmth, joint line tenderness, patellar tenderness, or condyle  tenderness. ROM full in flexion and extension and lower leg rotation. Ligaments with solid consistent endpoints including ACL, PCL, LCL, MCL. Negative Mcmurray's, Apley's, and Thessalonian tests. Non painful patellar compression. Patellar glide without crepitus. Patellar and quadriceps tendons unremarkable. Hamstring and quadriceps strength is normal.   Hip: Right ROM IR: 15 Deg, ER: 25 Deg, Flexion: 120 Deg, Extension: 100 Deg, Abduction: 45 Deg, Adduction: 20 Deg Strength IR: 5/5, ER: 5/5, Flexion: 5/5, Extension: 5/5, Abduction: 5/5, Adduction: 5/5 Pelvic alignment unremarkable to inspection and palpation. Standing hip rotation and gait without trendelenburg sign / unsteadiness. Greater trochanter without tenderness to palpation. No tenderness over piriformis and greater trochanter. No pain with FABER or FADIR. Moderate pain over the sacroiliac joint   MSK US performed of: right  This study was ordered, performed, and interpreted by Terrilee Files D.O.  Knee: All structures visualized. Anteromedial, anterolateral, posteromedial, and posterolateral menisci unremarkable without tearing, fraying, effusion, or displacement. Mild narrowing of the patellofemoral joint. Patellar Tendon unremarkable on long and transverse views without effusion. No abnormality of prepatellar bursa. LCL and MCL unremarkable on long and transverse views. No abnormality of origin of medial or lateral head of the gastrocnemius.  IMPRESSION:  Mild patellofemoral arthritis  Procedure note 97110; 15 minutes spent for Therapeutic exercises as stated in above notes.  This included exercises focusing on stretching, strengthening, with significant focus on eccentric aspects.  Flexion and extension exercises working on the vastus medialis oblique strengthening as well as hip abductor strengthening. Hamstring eccentrics given. Proper technique shown and discussed handout in great detail with ATC.  All questions were  discussed and answered.      Impression and Recommendations:     This case required medical decision making of moderate complexity.      Note: This dictation was prepared with Dragon dictation along with smaller phrase technology. Any transcriptional errors that result from this process are unintentional.

## 2016-09-29 NOTE — Patient Instructions (Addendum)
Good to see you.  Ice 20 minutes 2 times daily. Usually after activity and before bed. pennsaid pinkie amount topically 2 times daily as needed.  Exercises 3 times a week.  Tart cherry extract any dose at night  If not better when you see me again in 3 weeks we will maybe get labs.  Happy Valentine's Day

## 2016-09-29 NOTE — Assessment & Plan Note (Signed)
Mild arthritic changes of the knee noted. Patient does not have any swelling or any significant pain today. We discussed with patient at great length. We discussed icing regimen. Patient does have any increasing instability we would like patient come back. Patient given home exercises, icing, which activities doing which ones to avoid. Patient will topical anti-inflammatory's given. Follow-up again in 4 weeks.

## 2016-09-30 ENCOUNTER — Telehealth: Payer: Self-pay | Admitting: Family Medicine

## 2016-09-30 NOTE — Telephone Encounter (Signed)
Pt clled to have us cancel Appt. W/ [facility] because referring doctor sent her to [facility] [phone] cancel appt. Romeo AppleW/Jeannie. Fausto Skillern-glh

## 2016-10-20 ENCOUNTER — Encounter: Payer: Self-pay | Admitting: Family Medicine

## 2016-10-20 ENCOUNTER — Ambulatory Visit (INDEPENDENT_AMBULATORY_CARE_PROVIDER_SITE_OTHER): Payer: BLUE CROSS/BLUE SHIELD | Admitting: Family Medicine

## 2016-10-20 DIAGNOSIS — M1711 Unilateral primary osteoarthritis, right knee: Secondary | ICD-10-CM

## 2016-10-20 MED ORDER — GABAPENTIN 100 MG PO CAPS
200.0000 mg | ORAL_CAPSULE | Freq: Every day | ORAL | 3 refills | Status: DC
Start: 2016-10-20 — End: 2016-10-20

## 2016-10-20 MED ORDER — GABAPENTIN 100 MG PO CAPS: 200.0000 mg | ORAL_CAPSULE | Freq: Every day | ORAL | 3 refills | Status: DC

## 2016-10-20 NOTE — Assessment & Plan Note (Signed)
Worsening symptoms today. Patient states that nighttime pain seems to be worse. Encourage her to continue with the exercises. Patient was given once weekly vitamin D which she has not been taking regularly. Gabapentin prescribed today to help with sleep. We discussed formal physical therapy or injection with patient declined. Discussed with patient at great length and patient will follow-up with me again in 4 weeks Spent  25 minutes with patient face-to-face and had greater than 50% of counseling including as described above in assessment and plan.

## 2016-10-20 NOTE — Progress Notes (Signed)
Tawana ScaleZach Elgar Scoggins D.O. [facility] [address] [place], [place] [address] Phone: [phone] Subjective:      CC: Right knee pain f/u  BJY:NWGNFAOZHYHPI:Subjective  Leslie Romero is a 61 y.o. female coming in with complaint of right knee pain. Patient was seen previously one month ago and was diagnosed with patellofemoral arthritis of the right knee. Patient was doing the exercises and was doing better until she lost a handout. Has not been doing it for the last 2 weeks. Also is doing better when she was using the topical cream on a regular basis. Patient states that she continues to have some mild instability when she goes up and down stairs. Patient states that unfortunate she is elevated in both knees.   patient did have x-rays of the right knee taken 09/09/2016. These were independently visualized by me. No significant bony abnormality with very mild arthritic changes .  Past Medical History:  Diagnosis Date  . Allergy   . Depression   . Hypertension    Past Surgical History:  Procedure Laterality Date  . ECTOPIC PREGNANCY SURGERY     x 2  . EYE SURGERY    . WISDOM TOOTH EXTRACTION     Social History   Social History  . Marital status: Married    Spouse name: N/A  . Number of children: N/A  . Years of education: N/A   Social History Main Topics  . Smoking status: Current Every Day Smoker    Packs/day: 1.00    Years: 40.00    Types: Cigarettes  . Smokeless tobacco: Never Used  . Alcohol use No  . Drug use: No  . Sexual activity: Yes    Birth control/ protection: None   Other Topics Concern  . None   Social History Narrative  . None   Allergies  Allergen Reactions  . Moxifloxacin Rash  . Oxycodone-Acetaminophen     Other reaction(s): Hallucinations   Family History  Problem Relation Age of Onset  . Thyroid disease Mother   . COPD Mother   . Cancer Father     lung  . Diabetes Father   . Hyperlipidemia Father   . Hypertension Father   . Stroke Father     . Thyroid disease Sister   . Diabetes Sister     Past medical history, social, surgical and family history all reviewed in electronic medical record.  No pertanent information unless stated regarding to the chief complaint.   Review of Systems: No headache, visual changes, nausea, vomiting, diarrhea, constipation, dizziness, abdominal pain, skin rash, fevers, chills, night sweats, weight loss, swollen lymph nodes, body aches, joint swelling, muscle aches, chest pain, shortness of breath, mood changes.    Objective  Blood pressure (!) 144/98, pulse 86, height 5' 1.5" (1.562 m), weight 166 lb (75.3 kg). Systems examined below as of 10/20/16   Systems examined below as of 10/20/16 General: NAD A&O x3 mood, affect normal  HEENT: Pupils equal, extraocular movements intact no nystagmus Respiratory: not short of breath at rest or with speaking Cardiovascular: No lower extremity edema, non tender Skin: Warm dry intact with no signs of infection or rash on extremities or on axial skeleton. Abdomen: Soft nontender, no masses Neuro: Cranial nerves  intact, neurovascularly intact in all extremities with 2+ DTRs and 2+ pulses. Lymph: No lymphadenopathy appreciated today  Gait normal with good balance and coordination.  MSK: Non tender with full range of motion and good stability and symmetric strength and tone  of shoulders, elbows, wrist, hips and ankles bilaterally.   Knee:right  Normal to inspection with no erythema or effusion or obvious bony abnormalities. Mild tenderness still over the patellofemoral joint. ROM full in flexion and extension and lower leg rotation. Ligaments with solid consistent endpoints including ACL, PCL, LCL, MCL. Negative Mcmurray's, Apley's, and Thessalonian tests. Mild painful patellar compression. Patellar glide with moderate crepitus. Patellar and quadriceps tendons unremarkable. Hamstring and quadriceps strength is normal.  Lateral knee seems to be giving her  more pain as well mostly over the patellofemoral joint. Positive grind test noted.      Impression and Recommendations:     This case required medical decision making of moderate complexity.      Note: This dictation was prepared with Dragon dictation along with smaller phrase technology. Any transcriptional errors that result from this process are unintentional.

## 2016-10-20 NOTE — Patient Instructions (Signed)
Good to see you  Leslie Romero is your friend.  Try gabapentin 200mg  at night to help with pain and sleep  If the knees are worse come back and we will try injection.  I do want to see you again  in 3-4 weeks

## 2016-10-26 ENCOUNTER — Ambulatory Visit (INDEPENDENT_AMBULATORY_CARE_PROVIDER_SITE_OTHER): Payer: BLUE CROSS/BLUE SHIELD | Admitting: Adult Health

## 2016-10-26 ENCOUNTER — Encounter: Payer: Self-pay | Admitting: Adult Health

## 2016-10-26 VITALS — BP 143/84 | HR 87 | Temp 98.4°F | Ht 61.5 in | Wt 163.4 lb

## 2016-10-26 DIAGNOSIS — M791 Myalgia, unspecified site: Secondary | ICD-10-CM | POA: Insufficient documentation

## 2016-10-26 DIAGNOSIS — R350 Frequency of micturition: Secondary | ICD-10-CM | POA: Diagnosis not present

## 2016-10-26 DIAGNOSIS — R6889 Other general symptoms and signs: Secondary | ICD-10-CM | POA: Diagnosis not present

## 2016-10-26 DIAGNOSIS — M549 Dorsalgia, unspecified: Secondary | ICD-10-CM

## 2016-10-26 LAB — POCT URINALYSIS DIPSTICK
BILIRUBIN UA: NEGATIVE
GLUCOSE UA: NEGATIVE
Ketones, UA: NEGATIVE
LEUKOCYTES UA: NEGATIVE
NITRITE UA: NEGATIVE
Protein, UA: NEGATIVE
Spec Grav, UA: 1.02
UROBILINOGEN UA: 0.2
pH, UA: 6

## 2016-10-26 LAB — POCT INFLUENZA A/B
INFLUENZA A, POC: NEGATIVE
Influenza B, POC: NEGATIVE

## 2016-10-26 MED ORDER — HYDROCODONE-ACETAMINOPHEN 5-325 MG PO TABS: 1.0000 | ORAL_TABLET | Freq: Four times a day (QID) | ORAL | 0 refills | Status: DC | PRN

## 2016-10-26 MED ORDER — ONDANSETRON 8 MG PO TBDP: 8.0000 mg | ORAL_TABLET | Freq: Three times a day (TID) | ORAL | 1 refills | Status: DC | PRN

## 2016-10-26 NOTE — Patient Instructions (Signed)
Back Pain, Adult Back pain is very common in adults.The cause of back pain is rarely dangerous and the pain often gets better over time.The cause of your back pain may not be known. Some common causes of back pain include:  Strain of the muscles or ligaments supporting the spine.  Wear and tear (degeneration) of the spinal disks.  Arthritis.  Direct injury to the back. For many people, back pain may return. Since back pain is rarely dangerous, most people can learn to manage this condition on their own. Follow these instructions at home: Watch your back pain for any changes. The following actions may help to lessen any discomfort you are feeling:  Remain active. It is stressful on your back to sit or stand in one place for long periods of time. Do not sit, drive, or stand in one place for more than 30 minutes at a time. Take short walks on even surfaces as soon as you are able.Try to increase the length of time you walk each day.  Exercise regularly as directed by your health care provider. Exercise helps your back heal faster. It also helps avoid future injury by keeping your muscles strong and flexible.  Do not stay in bed.Resting more than 1-2 days can delay your recovery.  Pay attention to your body when you bend and lift. The most comfortable positions are those that put less stress on your recovering back. Always use proper lifting techniques, including:  Bending your knees.  Keeping the load close to your body.  Avoiding twisting.  Find a comfortable position to sleep. Use a firm mattress and lie on your side with your knees slightly bent. If you lie on your back, put a pillow under your knees.  Avoid feeling anxious or stressed.Stress increases muscle tension and can worsen back pain.It is important to recognize when you are anxious or stressed and learn ways to manage it, such as with exercise.  Take medicines only as directed by your health care provider.  Over-the-counter medicines to reduce pain and inflammation are often the most helpful.Your health care provider may prescribe muscle relaxant drugs.These medicines help dull your pain so you can more quickly return to your normal activities and healthy exercise.  Apply ice to the injured area:  Put ice in a plastic bag.  Place a towel between your skin and the bag.  Leave the ice on for 20 minutes, 2-3 times a day for the first 2-3 days. After that, ice and heat may be alternated to reduce pain and spasms.  Maintain a healthy weight. Excess weight puts extra stress on your back and makes it difficult to maintain good posture. Contact a health care provider if:  You have pain that is not relieved with rest or medicine.  You have increasing pain going down into the legs or buttocks.  You have pain that does not improve in one week.  You have night pain.  You lose weight.  You have a fever or chills. Get help right away if:  You develop new bowel or bladder control problems.  You have unusual weakness or numbness in your arms or legs.  You develop nausea or vomiting.  You develop abdominal pain.  You feel faint. This information is not intended to replace advice given to you by your health care provider. Make sure you discuss any questions you have with your health care provider. Document Released: 08/02/2005 Document Revised: 12/11/2015 Document Reviewed: 12/04/2013 Elsevier Interactive Patient Education  2017 [employer]   Inc.  Take medications as directed. Sips fluids, rest. Please call clinic tomorrow to let us know how you are feeling.  If not improved, will send for imaging. If symptoms worsen aka become unbearable, please seek immediate medical care at Urgent Care/Emergency Department.

## 2016-10-26 NOTE — Assessment & Plan Note (Addendum)
Take medications as directed. Sips fluids, rest. Please call clinic tomorrow to let us know how you are feeling.  If not improved, will send for imaging (since her pain is so intense and there was no acute trauma/inury prior to onset of pain). If symptoms worsen aka become unbearable, please seek immediate medical care at Urgent Care/Emergency Department.

## 2016-10-26 NOTE — Assessment & Plan Note (Signed)
UA negative

## 2016-10-26 NOTE — Progress Notes (Signed)
Subjective:    Patient ID: Leslie Romero, female    DOB: Nov 15, 1955, 61 y.o.   MRN: [id number]  HPI:  Leslie Romero presents with productive cough (thick/yellow sputum) and back pain that both began last Friday.  She has also had chills/fatigue/poor appetite/nausea since Friday.  Pain:  Initially started at thoracic back, now is from base of cervical neck to lumbar back.  Pain radiates to front of hips bilaterally and down RLE.  Pain is constant, 10/10 and interrupting her sleep.  She also reports frequent urination since Friday afternoon.  She denies acute trauma/injury prior to onset of pain.  She denies this "sort of pain ever occurring before", however she has hx of chronic back, hip, knee pain-followed by Orthopedic specialist recently.  She is concerned that "I am having a stroke or it's an aneurysm, b/c my sister-in-law died from an aneurysm in her sleep when they just thought it was back pain".  She has been using rest and OTC Acetaminophen with only minimal sx relief (approx 30 mins of pain relief).     Patient Care Team    Relationship Specialty Notifications Start End  Thomasene Lot, DO PCP - General Family Medicine  06/16/16     Patient Active Problem List   Diagnosis Date Noted  . Myalgia 10/26/2016  . Flu-like symptoms 10/26/2016  . Frequent urination 10/26/2016  . Patellofemoral arthritis of right knee 09/29/2016  . COPD GOLD III 09/21/2016  . Multiple pulmonary nodules determined by computed tomography of lung 09/21/2016  . Medial epicondylitis of elbow, right 08/27/2016  . Vitamin D insufficiency 07/20/2016  . Encounter for screening for lung cancer 07/20/2016  . SOB (shortness of breath) on exertion 07/20/2016  . 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 son w/ bipolar disorder 06/20/2016  . GAD (generalized anxiety disorder) 06/20/2016  . Adjustment disorder with mixed anxiety and depressed  mood 06/20/2016  . Elevated blood pressure reading in office without diagnosis of hypertension 06/20/2016  . Recurrent knee pain- R  06/16/2016  . History of gastritis 06/16/2016  . Cigarette smoker 06/16/2016  . Tobacco abuse counseling 06/16/2016  . Fatigue 07/29/2008  . DIARRHEA-PRESUMED INFECTIOUS 07/10/2008  . Duodenitis determined by biopsy '09 06/07/2008  . GERD 06/03/2008  . DYSPHAGIA 06/03/2008  . ABDOMINAL PAIN-EPIGASTRIC 06/03/2008     Past Medical History:  Diagnosis Date  . Allergy   . Depression   . Hypertension      Past Surgical History:  Procedure Laterality Date  . ECTOPIC PREGNANCY SURGERY     x 2  . EYE SURGERY    . WISDOM TOOTH EXTRACTION       Family History  Problem Relation Age of Onset  . Thyroid disease Mother   . COPD Mother   . Cancer Father     lung  . Diabetes Father   . Hyperlipidemia Father   . Hypertension Father   . Stroke Father   . Thyroid disease Sister   . Diabetes Sister      History  Drug Use No     History  Alcohol Use No     History  Smoking Status  . Current Every Day Smoker  . Packs/day: 1.00  . Years: 40.00  . Types: Cigarettes  Smokeless Tobacco  . Never Used     Outpatient Encounter Prescriptions as of 10/26/2016  Medication Sig Note  . acetaminophen (TYLENOL) 325 MG  tablet Take 650 mg by mouth every 6 (six) hours as needed.   Marland Kitchen albuterol (PROVENTIL HFA;VENTOLIN HFA) 108 (90 BASE) MCG/ACT inhaler Inhale 2 puffs into the lungs every 4 (four) hours as needed for wheezing (cough, shortness of breath or wheezing.).   Marland Kitchen Cholecalciferol (VITAMIN D-3) 5000 units TABS Take 1 tablet by mouth daily.   . Glycopyrrolate-Formoterol (BEVESPI AEROSPHERE) 9-4.8 MCG/ACT AERO Inhale 2 puffs into the lungs 2 (two) times daily.   . magnesium 30 MG tablet Take 30 mg by mouth 2 (two) times daily.   . Multiple Vitamins-Minerals (MULTIVITAMIN WITH MINERALS) tablet Take 1 tablet by mouth daily.   Marland Kitchen omeprazole (PRILOSEC)  40 MG capsule Take 1 capsule by mouth daily. 06/16/2016: Received from: [facility] Received Sig: Take one capsule (40 mg total) by mouth daily.  . Vitamin D, Ergocalciferol, (DRISDOL) 50000 units CAPS capsule Take 1 capsule (50,000 Units total) by mouth every 7 (seven) days.   . Zinc 100 MG TABS Take by mouth.   . Diclofenac Sodium (PENNSAID) 2 % SOLN Place 2 application onto the skin 2 (two) times daily. (Patient not taking: Reported on 10/26/2016)   . gabapentin (NEURONTIN) 100 MG capsule Take 2 capsules (200 mg total) by mouth at bedtime. (Patient not taking: Reported on 10/26/2016)   . HYDROcodone-acetaminophen (NORCO/VICODIN) 5-325 MG tablet Take 1 tablet by mouth every 6 (six) hours as needed for moderate pain.   Marland Kitchen ondansetron (ZOFRAN-ODT) 8 MG disintegrating tablet Take 1 tablet (8 mg total) by mouth every 8 (eight) hours as needed for nausea or vomiting.    Facility-Administered Encounter Medications as of 10/26/2016  Medication  . ipratropium (ATROVENT) nebulizer solution 0.5 mg    Allergies: Moxifloxacin and Oxycodone-acetaminophen  Body mass index is 30.37 kg/m.  Blood pressure (!) 143/84, pulse 87, temperature 98.4 F (36.9 C), temperature source Oral, height 5' 1.5" (1.562 m), weight 163 lb 6.4 oz (74.1 kg).   Review of Systems  Constitutional: Positive for activity change, appetite change, chills and fatigue. Negative for diaphoresis, fever and unexpected weight change.  HENT: Positive for sore throat. Negative for congestion, postnasal drip, rhinorrhea, sinus pain, sinus pressure, sneezing, tinnitus, trouble swallowing and voice change.   Eyes: Negative for visual disturbance.  Respiratory: Positive for cough. Negative for chest tightness, shortness of breath, wheezing and stridor.   Cardiovascular: Negative for chest pain, palpitations and leg swelling.  Gastrointestinal: Positive for diarrhea. Negative for abdominal distention, abdominal pain, blood in stool,  constipation, nausea, rectal pain and vomiting.  Endocrine: Negative for cold intolerance, heat intolerance, polydipsia, polyphagia and polyuria.  Genitourinary: Positive for frequency. Negative for difficulty urinating and flank pain.       Objective:   Physical Exam  Constitutional: She is oriented to person, place, and time. She appears well-developed and well-nourished. She appears distressed.  Very restless and appears very uncomfortable due to pain.  Constantly moving and readjusting in the chair and while on examination table.  HENT:  Head: Normocephalic and atraumatic.  Eyes: Conjunctivae are normal. Pupils are equal, round, and reactive to light.  Cardiovascular: Normal rate, regular rhythm, normal heart sounds and intact distal pulses.   Pulmonary/Chest: Effort normal and breath sounds normal. No respiratory distress. She has no wheezes. She has no rales. She exhibits no tenderness.  Abdominal: Soft. Bowel sounds are normal. She exhibits no distension and no mass. There is no tenderness. There is no rigidity, no rebound, no guarding, no CVA tenderness, no tenderness at McBurney's point and negative  Murphy's sign.  No abdominal bruits appreciated.  Musculoskeletal: She exhibits tenderness. She exhibits no deformity.       Right hip: She exhibits tenderness.       Cervical back: She exhibits tenderness and pain.       Thoracic back: She exhibits tenderness and pain. She exhibits no deformity and no spasm.       Lumbar back: She exhibits tenderness and pain. She exhibits no spasm.  Neurological: She is alert and oriented to person, place, and time.  Skin: Skin is warm and dry. No rash noted. She is not diaphoretic. No erythema. No pallor.  Psychiatric: She has a normal mood and affect. Her behavior is normal. Judgment and thought content normal.          Assessment & Plan:   1. Myalgia   2. Flu-like symptoms   3. Frequent urination     Myalgia Take medications as  directed. Sips fluids, rest. Please call clinic tomorrow to let us know how you are feeling.  If not improved, will send for imaging (since her pain is so intense and there was no acute trauma/inury prior to onset of pain). If symptoms worsen aka become unbearable, please seek immediate medical care at Urgent Care/Emergency Department.  Flu-like symptoms Flu test negative.  Frequent urination UA negative.    FOLLOW-UP:  Return if symptoms worsen or fail to improve.

## 2016-10-26 NOTE — Assessment & Plan Note (Signed)
Flu test negative

## 2016-11-03 ENCOUNTER — Telehealth: Payer: Self-pay

## 2016-11-03 ENCOUNTER — Other Ambulatory Visit: Payer: Self-pay | Admitting: Adult Health

## 2016-11-03 ENCOUNTER — Other Ambulatory Visit: Payer: Self-pay

## 2016-11-03 DIAGNOSIS — J069 Acute upper respiratory infection, unspecified: Secondary | ICD-10-CM

## 2016-11-03 MED ORDER — FLUCONAZOLE 150 MG PO TABS: 150.0000 mg | ORAL_TABLET | Freq: Once | ORAL | 0 refills | Status: AC

## 2016-11-03 MED ORDER — AMOXICILLIN-POT CLAVULANATE 875-125 MG PO TABS: 1.0000 | ORAL_TABLET | Freq: Two times a day (BID) | ORAL | 0 refills | Status: DC

## 2016-11-03 MED ORDER — BENZONATATE 200 MG PO CAPS: 200.0000 mg | ORAL_CAPSULE | Freq: Two times a day (BID) | ORAL | 0 refills | Status: DC | PRN

## 2016-11-03 MED ORDER — FLUCONAZOLE 150 MG PO TABS: 150.0000 mg | ORAL_TABLET | Freq: Once | ORAL | 0 refills | Status: DC

## 2016-11-03 NOTE — Progress Notes (Signed)
Initially seen 10/26/16 for cough/fever/sore throat/back pain. Back pain has resolved, however still having acute URI/pharyngitis. ABX, cough medication, and Diflucan called in.

## 2016-11-03 NOTE — Telephone Encounter (Signed)
Pt called and stated she was here on 10/26/16. Pt stated she is not any better and is having a really bad sore throat, chills, body aches, and is coughing really bad. Pt states she is having shortness of breath due to coughing so much. Pt states she is coughing up phlegm that is a yellowish color. Pt wants to know if she can get an antibiotic called in for her. Pt advised that is she gets worse or if she has difficulty breathing she needs to go to the ED. Please advise?

## 2016-11-03 NOTE — Telephone Encounter (Signed)
Pt informed. Pt expressed understanding and is agreeable. Pt states her back pain is much better. Liborio NixonAmanda Grissom CMA, RT

## 2016-11-03 NOTE — Telephone Encounter (Signed)
Morning, Please call pt and inform her that Augmentin (ABX), Tessalon (cough medication), and Diflucan (in care she develops yeast infection sx's during ABX treatment). Increase fluids/rest/vit c. Please ask if her back pain has resolved. If sx's persist after ABX completed, then she will need to make appt to be seen. If shortness of breath becomes severe, then please seek immediate medical care. Please call clinic with questions/concerns. Thanks! Orpha BurKaty

## 2016-11-17 ENCOUNTER — Ambulatory Visit: Payer: BLUE CROSS/BLUE SHIELD

## 2016-11-18 ENCOUNTER — Ambulatory Visit: Payer: BLUE CROSS/BLUE SHIELD | Admitting: Family Medicine

## 2016-11-25 ENCOUNTER — Ambulatory Visit (INDEPENDENT_AMBULATORY_CARE_PROVIDER_SITE_OTHER)
Admission: RE | Admit: 2016-11-25 | Discharge: 2016-11-25 | Disposition: A | Discharge status: Home / Self Care | Payer: BLUE CROSS/BLUE SHIELD | Source: Ambulatory Visit | Attending: Internal Medicine | Admitting: Internal Medicine

## 2016-11-25 DIAGNOSIS — R918 Other nonspecific abnormal finding of lung field: Secondary | ICD-10-CM

## 2016-11-26 ENCOUNTER — Ambulatory Visit (INDEPENDENT_AMBULATORY_CARE_PROVIDER_SITE_OTHER): Payer: BLUE CROSS/BLUE SHIELD | Admitting: Internal Medicine

## 2016-11-26 ENCOUNTER — Encounter: Payer: Self-pay | Admitting: Internal Medicine

## 2016-11-26 VITALS — BP 142/88 | HR 88 | Ht 61.5 in | Wt 164.0 lb

## 2016-11-26 DIAGNOSIS — R918 Other nonspecific abnormal finding of lung field: Secondary | ICD-10-CM | POA: Diagnosis not present

## 2016-11-26 DIAGNOSIS — F17210 Nicotine dependence, cigarettes, uncomplicated: Secondary | ICD-10-CM

## 2016-11-26 DIAGNOSIS — J449 Chronic obstructive pulmonary disease, unspecified: Secondary | ICD-10-CM

## 2016-11-26 LAB — PULMONARY FUNCTION TEST
DL/VA % pred: 92 %
DL/VA: 4.1 ml/min/mmHg/L
DLCO cor % pred: 75 %
DLCO cor: 15.86 ml/min/mmHg
DLCO unc % pred: 77 %
DLCO unc: 16.29 ml/min/mmHg
FEF 25-75 Post: 0.57 L/s
FEF 25-75 Pre: 0.35 L/s
FEF2575-%Change-Post: 61 %
FEF2575-%Pred-Post: 26 %
FEF2575-%Pred-Pre: 16 %
FEV1-%Change-Post: 29 %
FEV1-%Pred-Post: 48 %
FEV1-%Pred-Pre: 37 %
FEV1-Post: 1.13 L
FEV1-Pre: 0.87 L
FEV1FVC-%Change-Post: 25 %
FEV1FVC-%Pred-Pre: 56 %
FEV6-%Change-Post: 9 %
FEV6-%Pred-Post: 69 %
FEV6-%Pred-Pre: 63 %
FEV6-Post: 2.01 L
FEV6-Pre: 1.84 L
FEV6FVC-%Change-Post: 5 %
FEV6FVC-%Pred-Post: 101 %
FEV6FVC-%Pred-Pre: 96 %
FVC-%Change-Post: 3 %
FVC-%Pred-Post: 68 %
FVC-%Pred-Pre: 65 %
FVC-Post: 2.04 L
FVC-Pre: 1.97 L
Post FEV1/FVC ratio: 55 %
Post FEV6/FVC ratio: 98 %
Pre FEV1/FVC ratio: 44 %
Pre FEV6/FVC Ratio: 94 %
RV % pred: 127 %
RV: 2.37 L
TLC % pred: 89 %
TLC: 4.18 L

## 2016-11-26 MED ORDER — BUDESONIDE-FORMOTEROL FUMARATE 160-4.5 MCG/ACT IN AERO: INHALATION_SPRAY | RESPIRATORY_TRACT | 11 refills | Status: DC

## 2016-11-26 MED ORDER — ALBUTEROL SULFATE HFA 108 (90 BASE) MCG/ACT IN AERS: 2.0000 | INHALATION_SPRAY | RESPIRATORY_TRACT | 4 refills | Status: AC | PRN

## 2016-11-26 MED ORDER — BUDESONIDE-FORMOTEROL FUMARATE 160-4.5 MCG/ACT IN AERO: 2.0000 | INHALATION_SPRAY | Freq: Two times a day (BID) | RESPIRATORY_TRACT | 0 refills | Status: DC

## 2016-11-26 NOTE — Patient Instructions (Addendum)
Please see patient coordinator before you leave today  to schedule pulmonary rehab  Plan A = Automatic = change to Symbicort 160 Take 2 puffs first thing in am and then another 2 puffs about 12 hours later.    Work on inhaler technique:  relax and gently blow all the way out then take a nice smooth deep breath back in, triggering the inhaler at same time you start breathing in.  Hold for up to 5 seconds if you can. Blow out thru nose. Rinse and gargle with water when done    Plan B = Backup Only use your albuterol as a rescue medication to be used if you can't catch your breath by resting or doing a relaxed purse lip breathing pattern.  - The less you use it, the better it will work when you need it. - Ok to use the inhaler up to 2 puffs  every 4 hours if you must but call for appointment if use goes up over your usual need - Don't leave home without it !!  (think of it like the spare tire for your car)   The key is to stop smoking completely before smoking completely stops you!     Please schedule a follow up visit in 3 months but call sooner if needed

## 2016-11-26 NOTE — Assessment & Plan Note (Signed)
CT low dose screening Chest  08/30/16  Lung-RADS Category 4A, suspicious. Follow up low-dose chest CT without contrast in 3 months (please use the following order, "CT CHEST LCS NODULE FOLLOW-UP W/O CM") is recommended. Alternatively, PET may be considered when there is a solid component 8mm or larger. A posterior right upper lobe opacity is favored to represent an area of scarring, given relatively flat morphology on reformatted images. However, neoplasm cannot be excluded.  - Repeat CT 11/29/16 > no change, repeat in one year rec   Discussed in detail all the  indications, usual  risks and alternatives  relative to the benefits with patient who agrees to proceed with conservative f/u as outlined

## 2016-11-26 NOTE — Progress Notes (Signed)
PFT done today. 

## 2016-11-26 NOTE — Assessment & Plan Note (Signed)
>   3 min discussion I reviewed the Fletcher curve with the patient that basically indicates  if you quit smoking when your best day FEV1 is still    preserved (as is still relatively  the case here)  it is highly unlikely you will progress to severe disease and informed the patient there was  no medication on the market that has proven to alter the curve/ its downward trajectory  or the likelihood of progression of their disease(unlike other chronic medical conditions such as atheroclerosis where we do think we can change the natural hx with risk reducing meds)    Therefore stopping smoking and maintaining abstinence is the most important aspect of care, not choice of inhalers or for that matter, doctors.   

## 2016-11-26 NOTE — Progress Notes (Signed)
Subjective:     Patient ID: Leslie Romero, female   DOB: November 06, 1955,     MRN: [id number]    Brief patient profile: 41  yowf active smoker with tendency to coughing fits started in late elementary school and continued as adult but pattern has been sporadic and then  Noted indolent onset slowly progressive  doe x 2012 x fast walk  With MPNS on lung cancer screening study referred to pulmonary clinic 09/21/2016 by Dr   Sharee Holster    History of Present Illness  09/21/2016 1st  Pulmonary office visit/ Janard Culp   Chief Complaint  Patient presents with  . Pulmonary Consult    Referred by Dr. Sharee Holster.  Pt c/o "stabbing CP" at night when she lies down. She has noticed this over the past 6 months. She states she has been having SOB for the past 3 yrs "seems like I am SOB alot of the time".    cp's are migratory/ worse with coughing/  am phlegm x tsp mucoid / never bloody. Doe = MMRC1 = can walk nl pace, flat grade, can't hurry or go uphills or steps s sob   rec The key is to stop smoking completely before smoking completely stops you - it's not too late Plan A = Automatic = Bevespi Take 2 puffs first thing in am and then another 2 puffs about 12 hours later.  Plan B = Backup Only use your albuterol as a rescue medication   Return about 11/29/16 with CT and pfts      11/26/2016  f/u ov/Frieda Arnall re:  GOLD III  On bevespi but not using bid  Chief Complaint  Patient presents with  . Follow-up    discuss PFT  no real change doe but no longer saba dep since of bevespi   No obvious day to day or daytime variability or assoc excess/ purulent sputum or mucus plugs or hemoptysis or cp or chest tightness, subjective wheeze or overt sinus or hb symptoms. No unusual exp hx or h/o childhood pna/ asthma or knowledge of premature birth.  Sleeping ok without nocturnal  or early am exacerbation  of respiratory  c/o's or need for noct saba. Also denies any obvious fluctuation of symptoms with weather or environmental  changes or other aggravating or alleviating factors except as outlined above   Current Medications, Allergies, Complete Past Medical History, Past Surgical History, Family History, and Social History were reviewed in [employer] record.  ROS  The following are not active complaints unless bolded sore throat, dysphagia, dental problems, itching, sneezing,  nasal congestion or excess/ purulent secretions, ear ache,   fever, chills, sweats, unintended wt loss, classically pleuritic or exertional cp,  orthopnea pnd or leg swelling, presyncope, palpitations, abdominal pain, anorexia, nausea, vomiting, diarrhea  or change in bowel or bladder habits, change in stools or urine, dysuria,hematuria,  rash, arthralgias, visual complaints, headache, numbness, weakness or ataxia or problems with walking or coordination,  change in mood/affect or memory.             Objective:   Physical Exam  Pleasant obese wf nad    11/26/2016        164   09/21/16 165 lb (74.8 kg)  09/09/16 164 lb 14.4 oz (74.8 kg)  08/26/16 164 lb (74.4 kg)    Vital signs reviewed - Note on arrival 02 sats  96% on RA     HEENT: nl dentition, turbinates, and oropharynx. Nl external ear canals without cough  reflex   NECK :  without JVD/Nodes/TM/ nl carotid upstrokes bilaterally   LUNGS: no acc muscle use,  Nl contour chest which is clear to A and P bilaterally without cough on insp or exp maneuvers   CV:  RRR  no s3 or murmur or increase in P2, nad no edema   ABD:  soft and nontender with nl inspiratory excursion in the supine position. No bruits or organomegaly appreciated, bowel sounds nl  MS:  Nl gait/ ext warm without deformities, calf tenderness, cyanosis or clubbing No obvious joint restrictions   SKIN: warm and dry without lesions    NEURO:  alert, approp, nl sensorium with  no motor or cerebellar deficits apparent.          I personally reviewed images and agree with radiology  impression as follows:  CT LCS  Chest  11/25/16 1. Lung-RADS Category 2, benign appearance or behavior. Continue annual screening with low-dose chest CT without contrast in 12 months 2. Emphysema.    Assessment:

## 2016-11-26 NOTE — Assessment & Plan Note (Signed)
Spirometry 09/21/2016  FEV1 1.02 (43%)  Ratio 52 s previous rx -  09/21/2016   try bevespi 2bid    - 11/26/2016  After extensive coaching HFA effectiveness =   75% > change to symbicort 160 - PFT's  11/26/2016  FEV1 1.13 (48 % ) ratio 55  p 29 % improvement from saba p nothing prior to study with DLCO  77/75 % corrects to 92 % for alv volume    Moderately severe copd with marked reversibility and may do better on symb for AB component, at least while actively smoking, so try symb 160 2 bid and work hard on not smoking (see separate a/p)   I had an extended discussion with the patient reviewing all relevant studies completed to date and  lasting 15 to 20 minutes of a 25 minute visit    Each maintenance medication was reviewed in detail including most importantly the difference between maintenance and prns and under what circumstances the prns are to be triggered using an action plan format that is not reflected in the computer generated alphabetically organized AVS.    Please see AVS for specific instructions unique to this visit that I personally wrote and verbalized to the the pt in detail and then reviewed with pt  by my nurse highlighting any  changes in therapy recommended at today's visit to their plan of care.

## 2017-02-19 ENCOUNTER — Other Ambulatory Visit: Payer: Self-pay | Admitting: Family Medicine

## 2017-02-22 ENCOUNTER — Ambulatory Visit: Payer: BLUE CROSS/BLUE SHIELD | Admitting: Internal Medicine

## 2017-03-30 ENCOUNTER — Encounter: Payer: Self-pay | Admitting: Internal Medicine

## 2017-03-30 ENCOUNTER — Ambulatory Visit (INDEPENDENT_AMBULATORY_CARE_PROVIDER_SITE_OTHER): Payer: BLUE CROSS/BLUE SHIELD | Admitting: Internal Medicine

## 2017-03-30 VITALS — BP 136/84 | HR 78 | Ht 61.5 in | Wt 161.6 lb

## 2017-03-30 DIAGNOSIS — J449 Chronic obstructive pulmonary disease, unspecified: Secondary | ICD-10-CM

## 2017-03-30 DIAGNOSIS — F17210 Nicotine dependence, cigarettes, uncomplicated: Secondary | ICD-10-CM | POA: Diagnosis not present

## 2017-03-30 MED ORDER — GLYCOPYRROLATE-FORMOTEROL 9-4.8 MCG/ACT IN AERO: 2.0000 | INHALATION_SPRAY | Freq: Two times a day (BID) | RESPIRATORY_TRACT | 0 refills | Status: DC

## 2017-03-30 MED ORDER — GLYCOPYRROLATE-FORMOTEROL 9-4.8 MCG/ACT IN AERO: 2.0000 | INHALATION_SPRAY | Freq: Two times a day (BID) | RESPIRATORY_TRACT | 11 refills | Status: DC

## 2017-03-30 NOTE — Progress Notes (Signed)
Subjective:     Patient ID: Leslie Romero, female   DOB: [birth year],     MRN: [id number]    Brief patient profile: 72  yowf active smoker with tendency to coughing fits started in late elementary school and continued as adult but pattern has been sporadic and then  Noted indolent onset slowly progressive  doe x 2012 x fast walk  With MPNS on lung cancer screening study referred to pulmonary clinic 09/21/2016 by Dr   Sharee Holster    History of Present Illness  09/21/2016 1st  Pulmonary office visit/ Hadiyah Maricle   Chief Complaint  Patient presents with  . Pulmonary Consult    Referred by Dr. Sharee Holster.  Pt c/o "stabbing CP" at night when she lies down. She has noticed this over the past 6 months. She states she has been having SOB for the past 3 yrs "seems like I am SOB alot of the time".    cp's are migratory/ worse with coughing/  am phlegm x tsp mucoid / never bloody. Doe = MMRC1 = can walk nl pace, flat grade, can't hurry or go uphills or steps s sob   rec The key is to stop smoking completely before smoking completely stops you - it's not too late Plan A = Automatic = Bevespi Take 2 puffs first thing in am and then another 2 puffs about 12 hours later.  Plan B = Backup Only use your albuterol as a rescue medication   Return about 11/29/16 with CT and pfts      11/26/2016  f/u ov/Bronte Kropf re:  GOLD III  On bevespi but not using bid  Chief Complaint  Patient presents with  . Follow-up    discuss PFT  no real change doe but no longer saba dep since on bevespi  rec Please see patient coordinator before you leave today  to schedule pulmonary rehab Plan A = Automatic = change to Symbicort 160 Take 2 puffs first thing in am and then another 2 puffs about 12 hours later.  Work on inhaler technique:    Plan B = Backup Only use your albuterol as a rescue medication   The key is to stop smoking completely before smoking completely stops you!       03/30/2017  f/u ov/Anquanette Bahner re:  Copd GOLD III no longer  on symb or gerd rx  Chief Complaint  Patient presents with  . Follow-up    pt has noticed increased nonprod cough, but states she is doing well overall .    doe = MMRC2 = can't walk a nl pace on a flat grade s sob but does fine slow and flat eg shopping ok  Main problem is thrush on symbicort, but  worse breathing since stopped it and also using saba but not more than 2 pffs  daily, only if overdoes it / no noct need/ dry cough is a bit worse since stopped gerd rx  No obvious day to day or daytime variability or assoc excess/ purulent sputum or mucus plugs or hemoptysis or cp or chest tightness, subjective wheeze or overt sinus or hb symptoms. No unusual exp hx or h/o childhood pna/ asthma or knowledge of premature birth.  Sleeping ok without nocturnal  or early am exacerbation  of respiratory  c/o's or need for noct saba. Also denies any obvious fluctuation of symptoms with weather or environmental changes or other aggravating or alleviating factors except as outlined above   Current Medications, Allergies, Complete Past Medical History,  Past Surgical History, Family History, and Social History were reviewed in [facility] electronic [facility].  ROS  The following are not active complaints unless bolded sore throat, dysphagia, dental problems, itching, sneezing,  nasal congestion or excess/ purulent secretions, ear ache,   fever, chills, sweats, unintended wt loss, classically pleuritic or exertional cp,  orthopnea pnd or leg swelling, presyncope, palpitations, abdominal pain, anorexia, nausea, vomiting, diarrhea  or change in bowel or bladder habits, change in stools or urine, dysuria,hematuria,  rash, arthralgias, visual complaints, headache, numbness, weakness or ataxia or problems with walking or coordination,  change in mood/affect or memory.               Objective:   Physical Exam  Pleasant mildly obese wf nad   03/30/2017        161  11/26/2016        164   09/21/16 165  lb (74.8 kg)  09/09/16 164 lb 14.4 oz (74.8 kg)  08/26/16 164 lb (74.4 kg)    Vital signs reviewed - Note on arrival 02 sats  96% on RA     HEENT: nl dentition, turbinates, and oropharynx. Nl external ear canals without cough reflex   NECK :  without JVD/Nodes/TM/ nl carotid upstrokes bilaterally   LUNGS: no acc muscle use,  Nl contour chest which is clear to A and P bilaterally without cough on insp or exp maneuvers   CV:  RRR  no s3 or murmur or increase in P2, nad no edema   ABD:  soft and nontender with nl inspiratory excursion in the supine position. No bruits or organomegaly appreciated, bowel sounds nl  MS:  Nl gait/ ext warm without deformities, calf tenderness, cyanosis or clubbing No obvious joint restrictions   SKIN: warm and dry without lesions    NEURO:  alert, approp, nl sensorium with  no motor or cerebellar deficits apparent.          I personally reviewed images and agree with radiology impression as follows:  CT LCS  Chest  11/25/16 1. Lung-RADS Category 2, benign appearance or behavior. Continue annual screening with low-dose chest CT without contrast in 12 months 2. Emphysema.    Assessment:

## 2017-03-30 NOTE — Patient Instructions (Addendum)
GERD (REFLUX)  is an extremely common cause of respiratory symptoms just like yours , many times with no obvious heartburn at all.    It can be treated with medication, but also with lifestyle changes including elevation of the head of your bed (ideally with 6 inch  bed blocks),  Smoking cessation, avoidance of late meals, excessive alcohol, and avoid fatty foods, chocolate, peppermint, colas, red wine, and acidic juices such as orange juice.  NO MINT OR MENTHOL PRODUCTS SO NO COUGH DROPS   USE SUGARLESS CANDY INSTEAD (Jolley ranchers or Stover's or Life Savers) or even ice chips will also do - the key is to swallow to prevent all throat clearing. NO OIL BASED VITAMINS - use powdered substitutes.  If start coughing for any reason :  Try prilosec otc 20mg   Take 30-60 min before first meal of the day and Pepcid ac (famotidine) 20 mg one @  bedtime until cough is completely gone for at least a week without the need for cough suppression     Change bevespi Take 2 puffs first thing in am and then another 2 puffs about 12 hours later.   Work on inhaler technique:  relax and gently blow all the way out then take a nice smooth deep breath back in, triggering the inhaler at same time you start breathing in.  Hold for up to 5 seconds if you can. Blow out thru nose. Rinse and gargle with water when done      The key is to stop smoking completely before smoking completely stops you!   Please schedule a follow up visit in 3 months but call sooner if needed

## 2017-03-31 NOTE — Assessment & Plan Note (Addendum)
>   3 m I took an extended  opportunity with this patient to outline the consequences of continued cigarette use  in airway disorders based on all the data we have from the multiple national lung health studies (perfomed over decades at millions of dollars in cost)  indicating that smoking cessation, not choice of inhalers or physicians, is the most important aspect of care.    At this point did not commit to quit.

## 2017-03-31 NOTE — Assessment & Plan Note (Signed)
Spirometry 09/21/2016  FEV1 1.02 (43%)  Ratio 52 s previous rx -  09/21/2016   try bevespi 2bid    - 11/26/2016  change to symbicort 160 - PFT's  11/26/2016  FEV1 1.13 (48 % ) ratio 55  p 29 % improvement from saba p nothing prior to study with DLCO  77/75 % corrects to 92 % for alv volume   - 03/30/2017  After extensive coaching HFA effectiveness =    75% with thrush on symb 160 > change back to bevespi   Pt is Group B in terms of symptom/risk and laba/lama therefore appropriate rx at this point unless starts to flare and since thrush with ics then bevespi best choice to avoid this problem anyway  Each maintenance medication was reviewed in detail including most importantly the difference between maintenance and as needed and under what circumstances the prns are to be used.  Please see AVS for specific  Instructions which are unique to this visit and I personally typed out  which were reviewed in detail in writing with the patient and a copy provided.

## 2017-06-22 ENCOUNTER — Telehealth: Payer: Self-pay | Admitting: Family Medicine

## 2017-06-22 ENCOUNTER — Other Ambulatory Visit: Payer: Self-pay | Admitting: Family Medicine

## 2017-06-22 NOTE — Telephone Encounter (Signed)
Needs office visit and fasting blood work.  Tell patient to make an appointment and come fasting so we can obtain blood work.

## 2017-06-22 NOTE — Telephone Encounter (Signed)
Provider required pt to set up appt in near future-- cld listed cell left message for patient to contact office.  --glh

## 2017-06-23 NOTE — Telephone Encounter (Signed)
Pt informed of results.  Pt expressed understanding and is agreeable.  T. Nelson, CMA 

## 2017-07-01 ENCOUNTER — Ambulatory Visit: Payer: BLUE CROSS/BLUE SHIELD | Admitting: Internal Medicine

## 2017-07-04 ENCOUNTER — Ambulatory Visit: Payer: BLUE CROSS/BLUE SHIELD | Admitting: Internal Medicine

## 2017-07-15 ENCOUNTER — Other Ambulatory Visit: Payer: Self-pay

## 2017-07-15 MED ORDER — VITAMIN D (ERGOCALCIFEROL) 1.25 MG (50000 UNIT) PO CAPS: 50000.0000 [IU] | ORAL_CAPSULE | ORAL | 5 refills | Status: DC

## 2017-07-15 NOTE — Telephone Encounter (Signed)
Pharmacy sent refill request for Vitamin D. Refill sent in to pharmacy. MPulliam, CMA/RT(R)'

## 2017-08-31 ENCOUNTER — Encounter: Payer: Self-pay | Admitting: Family Medicine

## 2017-08-31 ENCOUNTER — Ambulatory Visit (INDEPENDENT_AMBULATORY_CARE_PROVIDER_SITE_OTHER): Payer: BLUE CROSS/BLUE SHIELD | Admitting: Family Medicine

## 2017-08-31 VITALS — BP 140/75 | HR 72 | Ht 61.5 in | Wt 159.3 lb

## 2017-08-31 DIAGNOSIS — R5382 Chronic fatigue, unspecified: Secondary | ICD-10-CM

## 2017-08-31 DIAGNOSIS — I10 Essential (primary) hypertension: Secondary | ICD-10-CM

## 2017-08-31 DIAGNOSIS — Z716 Tobacco abuse counseling: Secondary | ICD-10-CM

## 2017-08-31 DIAGNOSIS — F4323 Adjustment disorder with mixed anxiety and depressed mood: Secondary | ICD-10-CM | POA: Diagnosis not present

## 2017-08-31 DIAGNOSIS — E661 Drug-induced obesity: Secondary | ICD-10-CM

## 2017-08-31 DIAGNOSIS — R030 Elevated blood-pressure reading, without diagnosis of hypertension: Secondary | ICD-10-CM | POA: Diagnosis not present

## 2017-08-31 DIAGNOSIS — F17210 Nicotine dependence, cigarettes, uncomplicated: Secondary | ICD-10-CM | POA: Diagnosis not present

## 2017-08-31 DIAGNOSIS — E559 Vitamin D deficiency, unspecified: Secondary | ICD-10-CM

## 2017-08-31 DIAGNOSIS — F411 Generalized anxiety disorder: Secondary | ICD-10-CM

## 2017-08-31 MED ORDER — SERTRALINE HCL 25 MG PO TABS: 12.5000 mg | ORAL_TABLET | Freq: Every day | ORAL | 1 refills | Status: DC

## 2017-08-31 MED ORDER — VITAMIN D (ERGOCALCIFEROL) 1.25 MG (50000 UNIT) PO CAPS: 50000.0000 [IU] | ORAL_CAPSULE | ORAL | 3 refills | Status: DC

## 2017-08-31 NOTE — Patient Instructions (Addendum)
Fasting labs today and then we will bring you back at my next available chronic follow-up appointment to discuss all the results.  Patient understands that even though she is going to have a follow-up in about 6 weeks, we will discuss the labs in full at the follow-up office visit unless there is pressing things that need to be addressed sooner we will contact her    -Also we need to transition your brain into thinking more positively.  These tasks below are some things I want you to do every day 1)  write 3 new things that you are grateful for every day for 21 days  2)  exercise daily- walk for 15 minutes twice a day every day 3)  you are going to journal every day about one positive experience that you had 4)  meditate every day.  You can go on YouTube and look for 15-minute relaxation meditation or what ever.  But we need to make sure that you are in the moment and relaxing and deep breathing every day 5)  Write 1 positive email every day to praise someone in your life     - If you have insomnia or difficulty sleeping, this information is for you:  - Avoid caffeinated beverages after lunch,  no alcoholic beverages,  no eating within 2-3 hours of lying down,  avoid exposure to blue light before bed,  avoid daytime naps, and  needs to maintain a regular sleep schedule- go to sleep and wake up around the same time every night.   - Resolve concerns or worries before entering bedroom:  Discussed relaxation techniques with patient and to keep a journal to write down fears\ worries.  I suggested seeing a counselor for CBT.   - Recommend patient meditate or do deep breathing exercises to help relax.   Incorporate the use of white noise machines or listen to "sleep meditation music", or recordings of guided meditations for sleep from YouTube which are free, such as  "guided meditation for detachment from over thinking"  by Ina Kick.       What is Chronic Stress Syndrome, Symptoms & Ways  to Deal With it   What is Chronic Stress Syndrome?  Chronic Stress Syndrome is something which can now be called as a medical condition due to the amount of stress an individual is going through these days. Chronic Stress Syndrome causes the body and mind to shutdown and the person has no control over himself or herself. Due to the demands of modern day life and the hardship throughout day and night takes its toll over a period of time and the body and brain starts demanding rest and a break. This leads to certain symptoms where your performance level starts to dip at work, you become irritable both at work and at home, you may stop enjoying activities you previously liked, you may become depressed, you may get angry for even small things. Chronic Stress Syndrome can significantly impact your quality life. Thus it is important understand the symptoms of Chronic Stress Syndrome and react accordingly in order to cope up with it.  It is important to note here that a balanced work-home equation should be drawn to cut down symptoms of Chronic Stress Syndrome. Minor stressors can be overcome by the body's inbuilt stress response but when there is unending stress for a long period of time then an external help is required to ease the stress.  Chronic Stress Syndrome can physically and psychologically drain you  over a period of time. For such cases stress management is the best way to cope up with Chronic Stress Syndrome. If Chronic Stress Syndrome is not treated then it may result in many health hazards like anxiety, muscle pain, insomnia, and high blood pressure along with a compromised immune system leading to frequent infections and missed days from work.    What are the Symptoms of Chronic Stress Syndrome?   The symptoms of Chronic Stress Syndrome are variable and range from generalized symptoms to emotional symptoms along with behavioral and cognitive symptoms. Some of these symptoms have been  delineated below:  Generalized Symptoms of Chronic Stress Syndrome are: Anxiety Depression Social isolation Headache Abdominal pain Lack of sleep Back pain Difficulty in concentrating Hypertension Hemorrhoids Varicose veins Panic attacks/ Panic disorder Cardiovascular diseases.   Some of the Emotional Symptoms of Chronic Stress Syndrome are: To become easily agitated, moody and frustrated Feeling overwhelmed which makes you feel like you are losing control. Having difficulty relaxing and have a peaceful mind Having low self esteem Feeling lonely Feeling worthless Feeling depressed Avoiding social environment.   Some of the Physical Symptoms of Chronic Stress Syndrome are: Headaches Lethargy Alternating diarrhea and constipation Nausea Muscles aches and pains Insomnia Rapid heartbeat and chest pain Infections and frequent colds Decreased libido Nervousness and shaking Tinnitus Sweaty palms Dry mouth Clenched jaw.  Some of the Cognitive Symptoms of Chronic Stress Syndrome are: Constant worrying Racing thoughts Disorganization and forgetfulness Inability to focus Poor judgment Abundance of negativity.  Some of the Behavioral Symptoms of Chronic Stress Syndrome are: Changes in appetite with less desire to eat Avoiding responsibilities Indulgence in alcohol or recreational drug use Increased nail biting and being fidgety Ways to Deal With Chronic Stress Syndrome    Chronic Stress Syndrome is not something which cannot be addressed. A bit of effort from your side in the form of lifestyle modifications, a little bit of exercise, a balanced work life equation can do wonders and help you get rid of Chronic Stress Syndrome.  Get Proper Sleep: It has been proved that Chronic Stress Syndrome causes loss of sleep where an individual may not even be able to sleep for days unending. This may result in the individual feeling lethargic and unable to focus at work the  following morning. This may lead to decreased performance at work. Thus, it is important to have a good sleep-wake cycle. For this, try and not drink any caffeinated beverage about four hours prior to going to sleep, as caffeine pumps up the adrenaline and causes you to stay awake resulting ultimately in Chronic Stress Syndrome.  Avoid Alcohol and Drugs: Another way to get rid of Chronic Stress Syndrome is lifestyle modifications. Stay away from alcohol and other recreational drugs. Take Short Frequent Breaks at Work: Try to take frequent breaks from work and do not work continuously. Try and manage your work in such a way that you even meet your deadline and come home on time for a happy dinner with family. A good time spent with family and kids does wonders in not only dealing with Chronic Stress Syndrome but also preventing it.  Become Physically Active: Another step towards getting rid of Chronic Stress Syndrome is physical activity. If you do not have time to spend at the gym then at least try and go for daily walks for about half an hour a day which not only keeps the stress away but also is good for your overall health. Physical activity leads  to production of endorphins which will make you feel relaxed and feel good.  Healthy Diet Can Help You Deal With Chronic Stress Syndrome: Have a balanced and healthy diet is another step towards a stress free life and keeping Chronic Stress Syndrome at bay. If time is a constraint then you can try eating three small meals a day. Try and avoid fast foods and take foods which are healthy and rich in proteins, fiber, and carbohydrates to boost your energy system.  Music Can Soothe Your Mind: Light music is one of the best and most effective relaxation techniques that one can try to overcome stress. It has shown to calm down the mind and take you away from all the stressors that you may be having. These days it is also being used as a therapy in some institutes for  overcoming stress. It is important here to discuss the importance of a good social support system for patients with Chronic Stress Syndrome, as a good social support framework can do wonders in taking the stress away from the patient and overcoming Chronic Stress Syndrome.  Meditation Can Help You Deal With Chronic Stress Syndrome Effectively: Meditation and yoga has also shown to be quite effective in relaxing the mind and coping up with Chronic Stress Syndrome   In cases where these measures are not helpful, then it is time for you to consult with a skilled psychologist or a psychiatrist for potential therapies or medications to control the stress response.   The psychologist can help you with a variety of steps for coping up with Chronic Stress Syndrome. Relaxation techniques and behavioral therapy are some of the methods employed by psychologists. In some cases, medications can also be given to help relax the patient.  Since Chronic Stress Syndrome is both emotionally and physically draining for the patient and it also adversely affects the family life of the patient hence it is important for the patient to recognize the condition and taking steps to cope up with it. Escaping measures like alcohol and drug use are of no help as they only aggravate the condition apart from their other health hazards. If this condition is ignored or left untreated it can lead to various medical conditions like anxiety and depression and various other medical conditions.  Last but not least, smile as often as you can as it is the best gift that you can give to someone. The best way to stay relaxed is to have a good smile, exercise daily, spend time with your family, meditation and if required consultation with a good psychologist so that you can live a stress free life and overcome the symptoms of Chronic Stress Syndrome.

## 2017-08-31 NOTE — Progress Notes (Signed)
Impression and Recommendations:    1. Adjustment disorder with mixed anxiety and depressed mood   2. Vitamin D insufficiency   3. Cigarette smoker   4. Tobacco abuse counseling   5. 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 elevated blood pressure   7. GAD (generalized anxiety disorder)   8. Chronic fatigue     1. Adjustment disorder with mixed anxiety and depressed mood:  Pt prescribed new medication of Zoloft 25 mg and instructed to take 1/2 tablets per day. Follow up in 6 weeks to discuss progress.   2. Vitamin D insufficiency: Continue taking your 5,000 IUs daily, in addition to your 50,000 IUs weekly. Will recheck labs.   3. Cigarette smoker: Pt strongly recommended to stop smoking.  Patient in pre-contemplative stages and not ready for change.  Told to return to clinic when she is ready and we can discuss further action plan  4. Tobacco abuse counseling: Stressed to pt the importance of quitting smoking when she feels she is ready.   5. Class 1 drug-induced obesity. Dietary and exercise guidelines discussed.    6. Hypertension: Recommended pt to exercise daily which can reduce your BP.  7. GAD: We will start 12.5 mg zoloft daily. Follow up in 6 weeks to discuss progress. Education and handouts provided.  8. Fatigue: Will check labs and discuss at next OV.  -Will obtain FBW today as she is fasting. Pt instructed to schedule an appointment in 6 week to discuss results as well as progress taking Zoloft.   Orders Placed This Encounter  Procedures  . Comprehensive metabolic panel  . Hemoglobin A1c  . Lipid panel  . TSH  . VITAMIN D 25 Hydroxy (Vit-D Deficiency, Fractures)  . CBC with Differential/Platelet  . T4, free  . Vitamin B12    Meds ordered this encounter  Medications  . Vitamin D, Ergocalciferol, (DRISDOL) 50000 units CAPS capsule    Sig: Take 1 capsule (50,000 Units total) by mouth  every 7 (seven) days.    Dispense:  12 capsule    Refill:  3  . sertraline (ZOLOFT) 25 MG tablet    Sig: Take 0.5 tablets (12.5 mg total) by mouth daily.    Dispense:  30 tablet    Refill:  1    Gross side effects, risk and benefits, and alternatives of medications and treatment plan in general discussed with patient.  Patient is aware that all medications have potential side effects and we are unable to predict every side effect or drug-drug interaction that may occur.   Patient will call with any questions prior to using medication if they have concerns.  Expresses verbal understanding and consents to current therapy and treatment regimen.  No barriers to understanding were identified.  Red flag symptoms and signs discussed in detail.  Patient expressed understanding regarding what to do in case of emergency\urgent symptoms  Please see AVS handed out to patient at the end of our visit for further patient instructions/ counseling done pertaining to today's office visit.   Return for 6-7wks recheck after starting zoloft..    Note: This note was prepared with assistance of Dragon voice recognition software. Occasional wrong-word or sound-a-like substitutions may have occurred due to the inherent limitations of voice recognition software.  This document serves as a record of services personally performed by Thomasene Lot, DO. It was created on her behalf by Thelma Barge, a trained medical scribe.  The creation of this record is based on the scribe's personal observations and the provider's statements to them.   I have reviewed the above medical documentation for accuracy and completeness and I concur.  Thomasene LotDeborah Eyleen Rawlinson 08/31/17 6:05 PM   --------------------------------------------------------------------------------------------------------------------------------------------------------------------------------------------------------------------------------------------    Subjective:       HPI: Leslie Romero is a 62 y.o. female who [facility] issues as discussed below.   Vitamin D:  She usually takes 5000 IUs daily and notices a big difference in her mood. She also takes 50,000 IUs weekly. She still feels tired throughout the week, however.  Sugars: Pt does not test her blood sugars at home regularly, but does on occasion because her husband has DM and she will check hers at the same time as he does his.   Smoking:  Pt still smokes 1 pack/day and reports being stressed out by recent deaths of people in her community. She also has been stressed out by her son and his developmental delays. She is still not ready to quit smoking at the moment.  Mood: Pt previously took Paxil and prozac after her son died in 2004. She reports them not working well for her. She has tried Wellbutrin before to quit smoking but she does not recall the effects it had on her mood, as this was many years ago (20+)  Fatigue: Pt states she has felt tired "since birth" and it is likely from stress, especially from stressors at home.    Wt Readings from Last 3 Encounters:  08/31/17 159 lb 4.8 oz (72.3 kg)  03/30/17 161 lb 9.6 oz (73.3 kg)  11/26/16 164 lb (74.4 kg)   BP Readings from Last 3 Encounters:  08/31/17 140/75  03/30/17 136/84  11/26/16 (!) 142/88   Pulse Readings from Last 3 Encounters:  08/31/17 72  03/30/17 78  11/26/16 88   BMI Readings from Last 3 Encounters:  08/31/17 29.61 kg/m  03/30/17 30.04 kg/m  11/26/16 30.49 kg/m     Patient Care Team    Relationship Specialty Notifications Start End  Thomasene Lotpalski, Chandria Rookstool, DO PCP - General Family Medicine  06/16/16      Patient Active Problem List   Diagnosis Date Noted  . Vitamin D insufficiency 07/20/2016    Priority: Low  . Myalgia 10/26/2016  . Flu-like symptoms 10/26/2016  . Frequent urination 10/26/2016  . Patellofemoral arthritis of right knee 09/29/2016  . COPD  GOLD III 09/21/2016  . Multiple pulmonary nodules determined by computed tomography of lung 09/21/2016  . Medial epicondylitis of elbow, right 08/27/2016  . Hypertension 08/26/2016  . Encounter for screening for lung cancer 07/20/2016  . SOB (shortness of breath) on exertion 07/20/2016  . 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 son w/ bipolar disorder 06/20/2016  . GAD (generalized anxiety disorder) 06/20/2016  . Adjustment disorder with mixed anxiety and depressed mood 06/20/2016  . Elevated blood pressure reading in office without diagnosis of hypertension 06/20/2016  . Recurrent knee pain- R  06/16/2016  . History of gastritis 06/16/2016  . Cigarette smoker 06/16/2016  . Tobacco abuse counseling 06/16/2016  . Fatigue 07/29/2008  . DIARRHEA-PRESUMED INFECTIOUS 07/10/2008  . Duodenitis determined by biopsy '09 06/07/2008  . GERD 06/03/2008  . DYSPHAGIA 06/03/2008  . ABDOMINAL PAIN-EPIGASTRIC 06/03/2008    Past Medical history, Surgical history, Family history, Social history, Allergies and Medications have been  entered into the medical record, reviewed and changed as needed.    Current Meds  Medication Sig  . acetaminophen (TYLENOL) 325 MG tablet Take 650 mg by mouth every 6 (six) hours as needed.  Marland Kitchen albuterol (PROVENTIL HFA;VENTOLIN HFA) 108 (90 Base) MCG/ACT inhaler Inhale 2 puffs into the lungs every 4 (four) hours as needed for wheezing (cough, shortness of breath or wheezing.).  Marland Kitchen BLACK ELDERBERRY,BERRY-FLOWER, PO Take by mouth.  . Cholecalciferol (VITAMIN D-3) 5000 units TABS Take 1 tablet by mouth daily.  . magnesium 30 MG tablet Take 30 mg by mouth 2 (two) times daily.  . Multiple Vitamins-Minerals (MULTIVITAMIN WITH MINERALS) tablet Take 1 tablet by mouth daily.  . Vitamin D, Ergocalciferol, (DRISDOL) 50000 units CAPS capsule Take 1 capsule (50,000 Units total) by mouth every 7 (seven) days.    . Zinc 100 MG TABS Take by mouth.  . [DISCONTINUED] benzonatate (TESSALON) 200 MG capsule Take 1 capsule (200 mg total) by mouth 2 (two) times daily as needed for cough.  . [DISCONTINUED] Diclofenac Sodium (PENNSAID) 2 % SOLN Place 2 application onto the skin 2 (two) times daily.  . [DISCONTINUED] Glycopyrrolate-Formoterol (BEVESPI AEROSPHERE) 9-4.8 MCG/ACT AERO Inhale 2 puffs into the lungs 2 (two) times daily.  . [DISCONTINUED] Vitamin D, Ergocalciferol, (DRISDOL) 50000 units CAPS capsule Take 1 capsule (50,000 Units total) by mouth every 7 (seven) days.   Current Facility-Administered Medications for the 08/31/17 encounter (Office Visit) with Thomasene Lot, DO  Medication  . ipratropium (ATROVENT) nebulizer solution 0.5 mg    Allergies:  Allergies  Allergen Reactions  . Moxifloxacin Rash  . Oxycodone-Acetaminophen     Other reaction(s): Hallucinations     Review of Systems:  A fourteen system review of systems was performed and found to be positive as per HPI.   Objective:   Blood pressure 140/75, pulse 72, height 5' 1.5" (1.562 m), weight 159 lb 4.8 oz (72.3 kg), SpO2 98 %. Body mass index is 29.61 kg/m. General:  Well Developed, well nourished, appropriate for stated age.  Neuro:  Alert and oriented,  extra-ocular muscles intact  HEENT:  Normocephalic, atraumatic, neck supple, no carotid bruits appreciated  Skin:  no gross rash, warm, pink. Cardiac:  RRR, S1 S2 Respiratory:  ECTA B/L and A/P, Not using accessory muscles, speaking in full sentences- unlabored. Vascular:  Ext warm, no cyanosis apprec.; cap RF less 2 sec. Psych:  No HI/SI, judgement and insight good, Euthymic mood. Full Affect.

## 2017-09-01 LAB — CBC WITH DIFFERENTIAL/PLATELET
BASOS: 1 %
Basophils Absolute: 0.1 10*3/uL (ref 0.0–0.2)
EOS (ABSOLUTE): 0.2 10*3/uL (ref 0.0–0.4)
EOS: 3 %
HEMATOCRIT: 43.3 % (ref 34.0–46.6)
HEMOGLOBIN: 14.1 g/dL (ref 11.1–15.9)
IMMATURE GRANS (ABS): 0 10*3/uL (ref 0.0–0.1)
Immature Granulocytes: 0 %
LYMPHS: 45 %
Lymphocytes Absolute: 3.4 10*3/uL — ABNORMAL HIGH (ref 0.7–3.1)
MCH: 28.6 pg (ref 26.6–33.0)
MCHC: 32.6 g/dL (ref 31.5–35.7)
MCV: 88 fL (ref 79–97)
Monocytes Absolute: 0.5 10*3/uL (ref 0.1–0.9)
Monocytes: 7 %
NEUTROS ABS: 3.3 10*3/uL (ref 1.4–7.0)
Neutrophils: 44 %
PLATELETS: 283 10*3/uL (ref 150–379)
RBC: 4.93 x10E6/uL (ref 3.77–5.28)
RDW: 13.7 % (ref 12.3–15.4)
WBC: 7.5 10*3/uL (ref 3.4–10.8)

## 2017-09-01 LAB — COMPREHENSIVE METABOLIC PANEL
ALT: 17 IU/L (ref 0–32)
AST: 16 IU/L (ref 0–40)
Albumin/Globulin Ratio: 1.7 (ref 1.2–2.2)
Albumin: 4.3 g/dL (ref 3.6–4.8)
Alkaline Phosphatase: 52 IU/L (ref 39–117)
BUN/Creatinine Ratio: 21 (ref 12–28)
BUN: 12 mg/dL (ref 8–27)
Bilirubin Total: 0.2 mg/dL (ref 0.0–1.2)
CALCIUM: 8.9 mg/dL (ref 8.7–10.3)
CO2: 22 mmol/L (ref 20–29)
CREATININE: 0.56 mg/dL — AB (ref 0.57–1.00)
Chloride: 104 mmol/L (ref 96–106)
GFR calc Af Amer: 117 mL/min/{1.73_m2} (ref >59)
GFR, EST NON AFRICAN AMERICAN: 102 mL/min/{1.73_m2} (ref >59)
GLOBULIN, TOTAL: 2.5 g/dL (ref 1.5–4.5)
Glucose: 77 mg/dL (ref 65–99)
Potassium: 5 mmol/L (ref 3.5–5.2)
SODIUM: 143 mmol/L (ref 134–144)
Total Protein: 6.8 g/dL (ref 6.0–8.5)

## 2017-09-01 LAB — TSH: TSH: 3.05 u[IU]/mL (ref 0.450–4.500)

## 2017-09-01 LAB — LIPID PANEL
Chol/HDL Ratio: 4.7 ratio — ABNORMAL HIGH (ref 0.0–4.4)
Cholesterol, Total: 208 mg/dL — ABNORMAL HIGH (ref 100–199)
HDL: 44 mg/dL (ref >39)
LDL Calculated: 132 mg/dL — ABNORMAL HIGH (ref 0–99)
TRIGLYCERIDES: 161 mg/dL — AB (ref 0–149)
VLDL CHOLESTEROL CAL: 32 mg/dL (ref 5–40)

## 2017-09-01 LAB — HEMOGLOBIN A1C
ESTIMATED AVERAGE GLUCOSE: 117 mg/dL
Hgb A1c MFr Bld: 5.7 % — ABNORMAL HIGH (ref 4.8–5.6)

## 2017-09-01 LAB — VITAMIN D 25 HYDROXY (VIT D DEFICIENCY, FRACTURES): Vit D, 25-Hydroxy: 63.5 ng/mL (ref 30.0–100.0)

## 2017-09-01 LAB — T4, FREE: Free T4: 0.91 ng/dL (ref 0.82–1.77)

## 2017-09-01 LAB — VITAMIN B12: Vitamin B-12: 339 pg/mL (ref 232–1245)

## 2017-10-20 ENCOUNTER — Ambulatory Visit: Payer: BLUE CROSS/BLUE SHIELD | Admitting: Family Medicine

## 2017-12-23 ENCOUNTER — Other Ambulatory Visit: Payer: Self-pay | Admitting: Family Medicine

## 2017-12-23 DIAGNOSIS — F17210 Nicotine dependence, cigarettes, uncomplicated: Secondary | ICD-10-CM

## 2017-12-23 DIAGNOSIS — I10 Essential (primary) hypertension: Secondary | ICD-10-CM

## 2017-12-23 DIAGNOSIS — F411 Generalized anxiety disorder: Secondary | ICD-10-CM

## 2017-12-23 DIAGNOSIS — F4323 Adjustment disorder with mixed anxiety and depressed mood: Secondary | ICD-10-CM

## 2017-12-23 DIAGNOSIS — E661 Drug-induced obesity: Secondary | ICD-10-CM

## 2018-01-07 ENCOUNTER — Other Ambulatory Visit: Payer: Self-pay | Admitting: Internal Medicine

## 2018-02-16 ENCOUNTER — Other Ambulatory Visit: Payer: Self-pay | Admitting: Family Medicine

## 2018-02-22 ENCOUNTER — Other Ambulatory Visit: Payer: Self-pay | Admitting: Family Medicine

## 2018-02-22 DIAGNOSIS — F4323 Adjustment disorder with mixed anxiety and depressed mood: Secondary | ICD-10-CM

## 2018-02-22 DIAGNOSIS — F411 Generalized anxiety disorder: Secondary | ICD-10-CM

## 2018-02-23 ENCOUNTER — Telehealth: Payer: Self-pay | Admitting: Family Medicine

## 2018-02-23 NOTE — Telephone Encounter (Signed)
Noted MPulliam, CMA/RT(R)  

## 2018-02-23 NOTE — Telephone Encounter (Signed)
Called pt to set up provider required OV for Rx refill of Zoloft-glh  --forwarding message to medical assistant as Lorain ChildesFYI.  --glh

## 2018-04-19 DIAGNOSIS — S0502XA Injury of conjunctiva and corneal abrasion without foreign body, left eye, initial encounter: Secondary | ICD-10-CM | POA: Diagnosis not present

## 2018-05-30 DIAGNOSIS — J019 Acute sinusitis, unspecified: Secondary | ICD-10-CM | POA: Diagnosis not present

## 2018-06-10 IMAGING — CT CT CHEST LUNG CANCER SCREENING LOW DOSE W/O CM
1 of 5 series · 14 of 40 positions shown, 18 images · non-contrast
Comparison: Plain films 06/15/2013.  No prior CT.

CLINICAL DATA: Asymptomatic current smoker. Thirty-nine pack-year
history.

EXAM:
CT CHEST WITHOUT CONTRAST LOW-DOSE FOR LUNG CANCER SCREENING
TECHNIQUE: Multidetector CT imaging of the chest was performed following the
standard protocol without IV contrast.

[Series 3: lung windows · axial · 0.70mm/px · z∈[-262,-4]mm · 14 of 233 slices shown, 18 images]
[im 13/233  mediastinal]
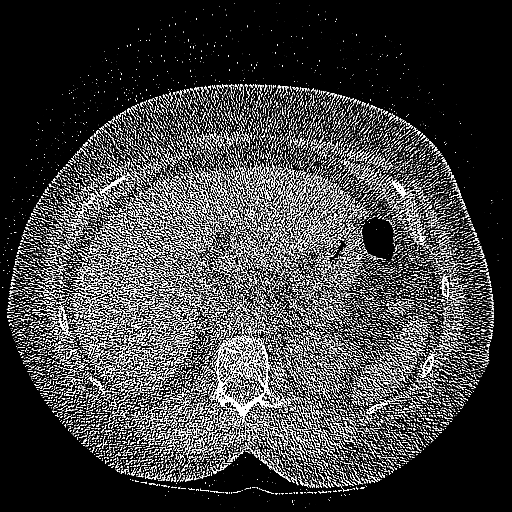
[im 13/233  lung]
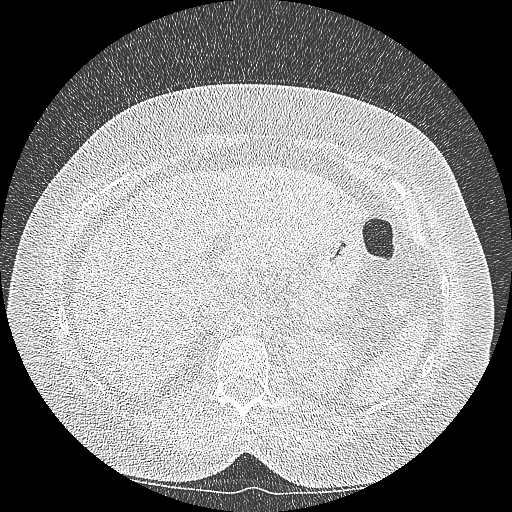
[im 25/233  lung]
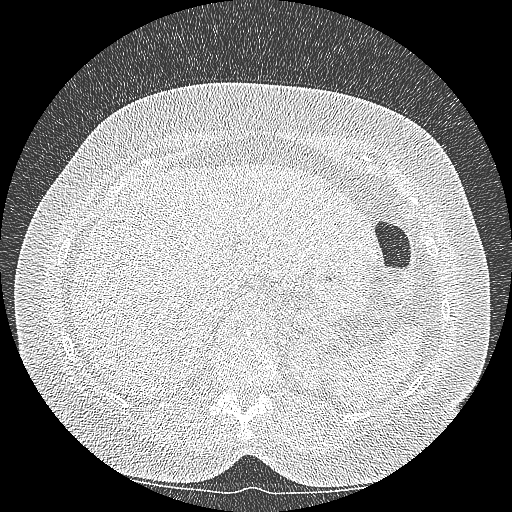
[im 49/233  lung]
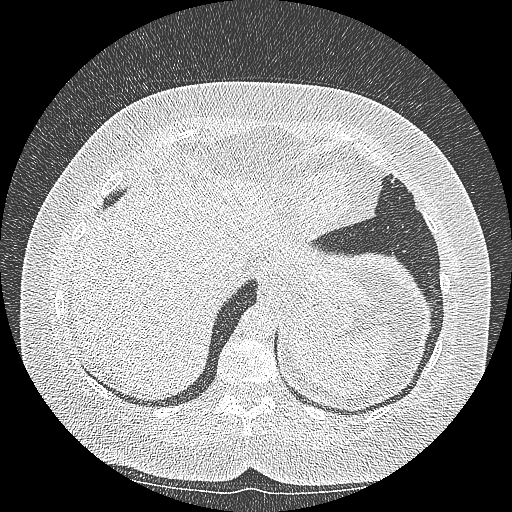
[im 62/233  lung]
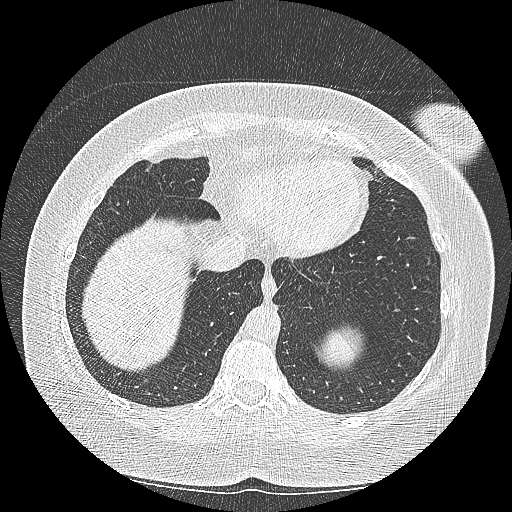
[im 74/233  mediastinal]
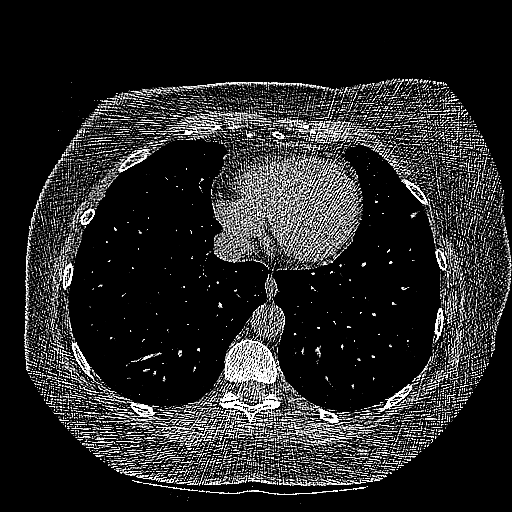
[im 74/233  lung]
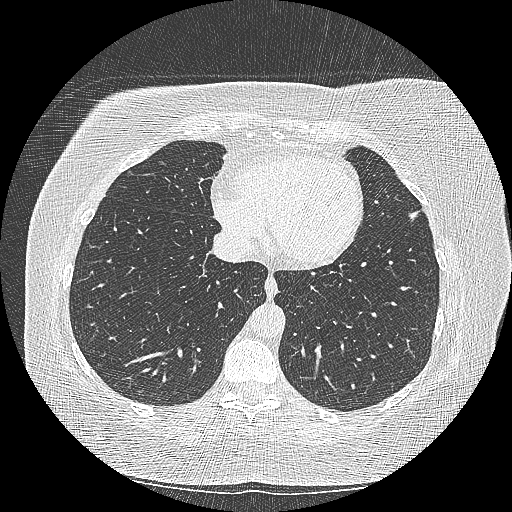
[im 98/233  lung]
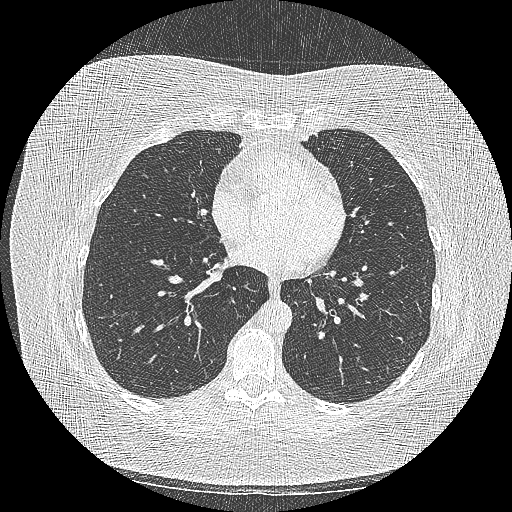
[im 110/233  lung]
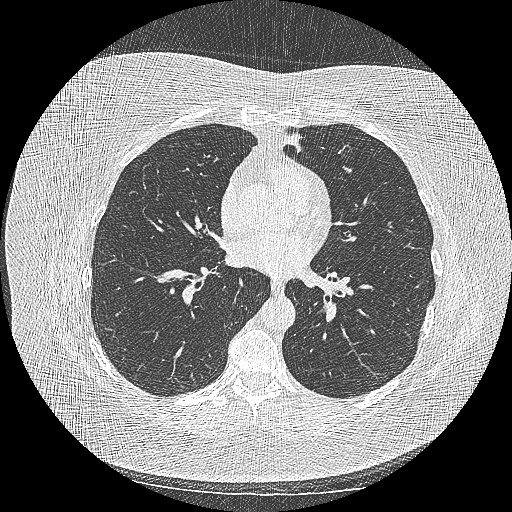
[im 123/233  lung]
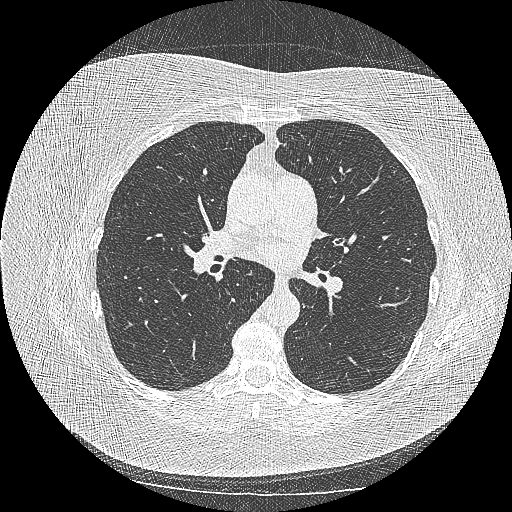
[im 135/233  mediastinal]
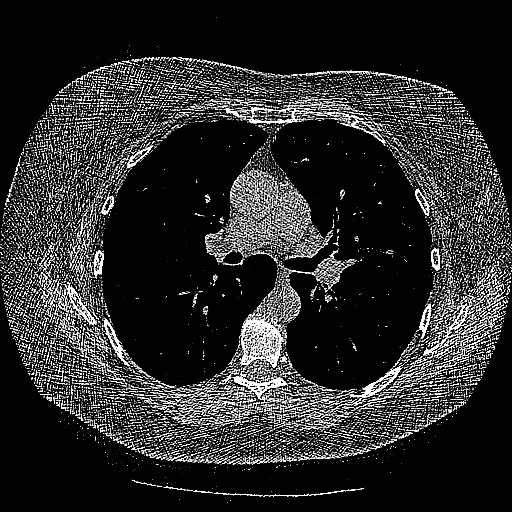
[im 135/233  lung]
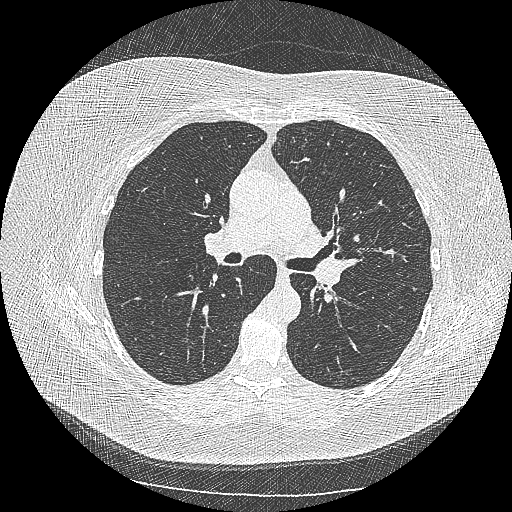
[im 159/233  lung]
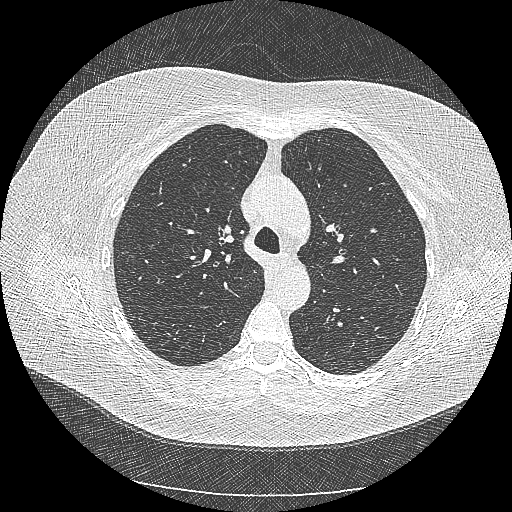
[im 171/233  lung]
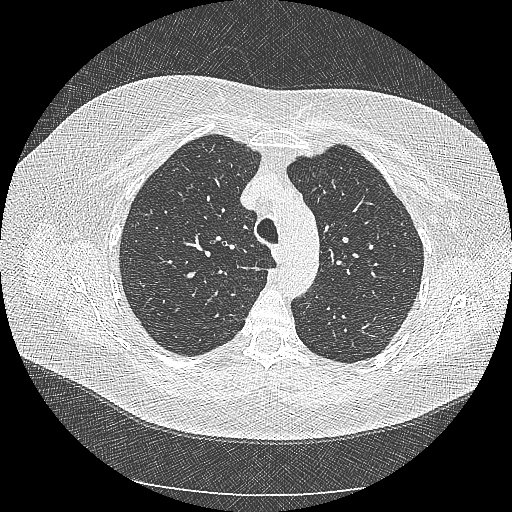
[im 184/233  lung]
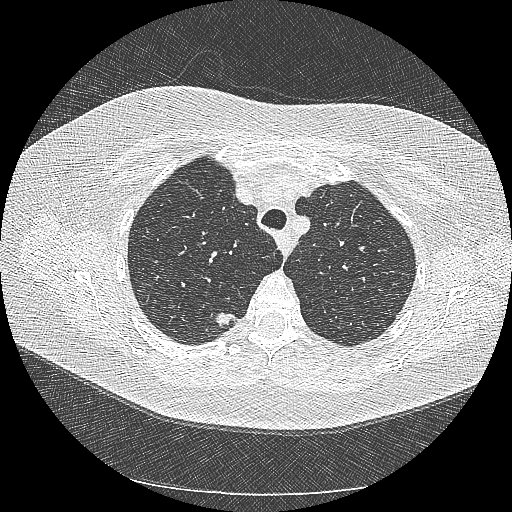
[im 208/233  mediastinal]
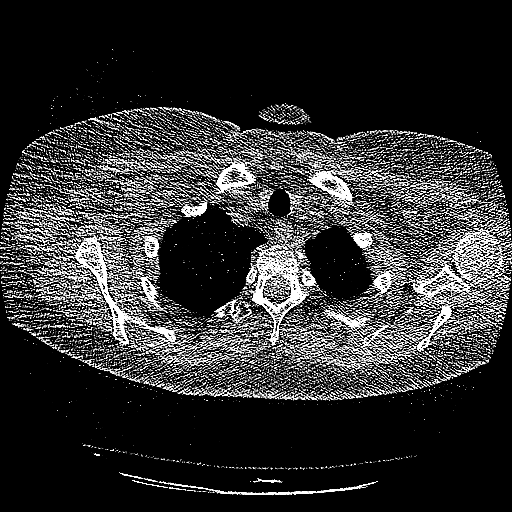
[im 208/233  lung]
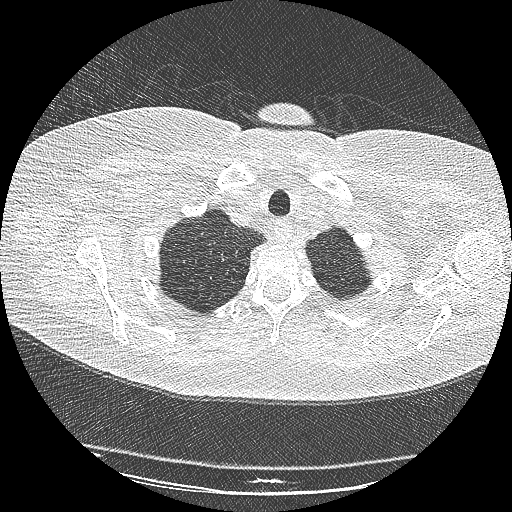
[im 220/233  lung]
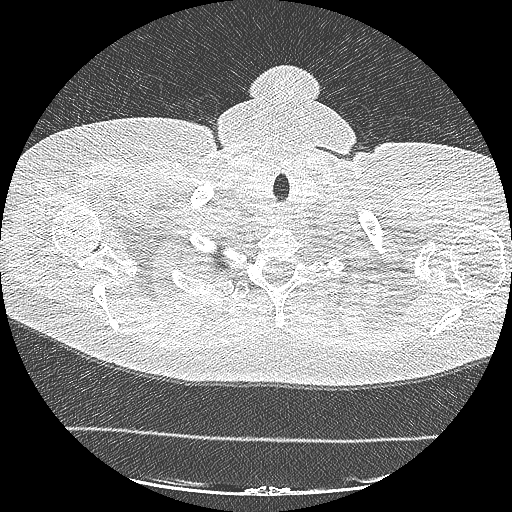

[14 of 40 positions shown; findings below may reference images not displayed]

FINDINGS: Cardiovascular: Normal heart size, without pericardial effusion.

Mediastinum/Nodes: Nodularity in both breasts, including at 12 mm on
the right on image 30/series 2 and 10 mm on the left on image
27/series 2. No mediastinal or definite hilar adenopathy, given
limitations of unenhanced CT.

Lungs/Pleura: No pleural fluid. Moderate centrilobular emphysema.
Probable secretions in the dependent trachea. Lower lobe predominant
bronchial wall thickening. Posterior right upper lobe opacity
measures volume derived equivalent diameter 11.2 mm, including on
image 49/series 3.

Right lower lobe subpleural density measures volume derived
equivalent diameter 3.5 mm.

Upper Abdomen: Normal imaged portions of the liver, spleen, stomach,
pancreas, adrenal glands, kidneys.

Musculoskeletal: No acute osseous abnormality.
IMPRESSION: 1. Lung-RADS Category 4A, suspicious. Follow up low-dose chest CT
without contrast in 3 months (please use the following order, "CT
CHEST LCS NODULE FOLLOW-UP W/O CM") is recommended. Alternatively,
PET may be considered when there is a solid component 8mm or larger.
A posterior right upper lobe opacity is favored to represent an area
of scarring, given relatively flat morphology on reformatted images.
However, neoplasm cannot be excluded.
2. Apparent nodularity in both breasts could represent nodular
parenchyma or intramammary lymph nodes. Neoplasia cannot be
excluded. Presuming not recently performed, consider further
evaluation with bilateral mammogram and possibly ultrasound.
These results will be called to the ordering clinician or
representative by the Radiologist Assistant, and communication
documented in the PACS or zVision Dashboard.

## 2018-06-15 ENCOUNTER — Encounter: Payer: Self-pay | Admitting: Internal Medicine

## 2018-07-11 ENCOUNTER — Telehealth: Payer: Self-pay | Admitting: Family Medicine

## 2018-07-11 NOTE — Telephone Encounter (Signed)
Noted.  T. Nelson, CMA 

## 2018-07-11 NOTE — Telephone Encounter (Signed)
Called pt to set up Provider required OV for Rx  Refill-- left message for pt to call office--- forwarding note to medical assistant as Lorain ChildesFYI.--glh

## 2018-07-25 ENCOUNTER — Other Ambulatory Visit: Payer: Self-pay | Admitting: Family Medicine

## 2018-09-22 DIAGNOSIS — J209 Acute bronchitis, unspecified: Secondary | ICD-10-CM | POA: Diagnosis not present

## 2018-09-22 DIAGNOSIS — R6883 Chills (without fever): Secondary | ICD-10-CM | POA: Diagnosis not present

## 2018-10-21 ENCOUNTER — Other Ambulatory Visit: Payer: Self-pay | Admitting: Family Medicine

## 2019-08-03 ENCOUNTER — Other Ambulatory Visit: Payer: Self-pay

## 2019-08-03 ENCOUNTER — Ambulatory Visit
Admission: EM | Admit: 2019-08-03 | Discharge: 2019-08-03 | Disposition: A | Discharge status: Home / Self Care | Payer: BC Managed Care – PPO | Attending: Physician Assistant | Admitting: Physician Assistant

## 2019-08-03 DIAGNOSIS — Z23 Encounter for immunization: Secondary | ICD-10-CM | POA: Diagnosis not present

## 2019-08-03 DIAGNOSIS — S0101XA Laceration without foreign body of scalp, initial encounter: Secondary | ICD-10-CM

## 2019-08-03 MED ORDER — TETANUS-DIPHTH-ACELL PERTUSSIS 5-2.5-18.5 LF-MCG/0.5 IM SUSP
0.5000 mL | Freq: Once | INTRAMUSCULAR | Status: AC
  Administered 2019-08-03: 0.5 mL via INTRAMUSCULAR

## 2019-08-03 NOTE — Discharge Instructions (Signed)
Staple removal in 8 days  

## 2019-08-03 NOTE — ED Provider Notes (Signed)
[facility]    CSN: [id number] Arrival date & time: 08/03/19  1134      History   Chief Complaint Chief Complaint  Patient presents with  . Laceration    HPI Leslie Romero is a 63 y.o. female.   The history is provided by the patient. No language interpreter was used.  Laceration Location:  Head/neck Head/neck laceration location:  Scalp Length:  3 Depth:  Through dermis Quality: straight   Bleeding: controlled   Laceration mechanism:  Fall Pain details:    Quality:  Aching   Severity:  Moderate   Progression:  Worsening Relieved by:  Nothing Worsened by:  Nothing Ineffective treatments:  None tried Tetanus status:  Unknown Pt hit the back of her head.  No loss of conciousness  Past Medical History:  Diagnosis Date  . Allergy   . Depression   . Hypertension     Patient Active Problem List   Diagnosis Date Noted  . Myalgia 10/26/2016  . Flu-like symptoms 10/26/2016  . Frequent urination 10/26/2016  . Patellofemoral arthritis of right knee 09/29/2016  . COPD GOLD III 09/21/2016  . Multiple pulmonary nodules determined by computed tomography of lung 09/21/2016  . Medial epicondylitis of elbow, right 08/27/2016  . Elevated blood pressure reading 08/26/2016  . Vitamin D insufficiency 07/20/2016  . Encounter for screening for lung cancer 07/20/2016  . SOB (shortness of breath) on exertion 07/20/2016  . 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 son w/ bipolar disorder 06/20/2016  . GAD (generalized anxiety disorder) 06/20/2016  . Adjustment disorder with mixed anxiety and depressed mood 06/20/2016  . Elevated blood pressure reading in office without diagnosis of hypertension 06/20/2016  . Recurrent knee pain- R  06/16/2016  . History of gastritis 06/16/2016  . Cigarette smoker 06/16/2016  . Tobacco abuse counseling 06/16/2016  . Fatigue 07/29/2008  .  DIARRHEA-PRESUMED INFECTIOUS 07/10/2008  . Duodenitis determined by biopsy '09 06/07/2008  . GERD 06/03/2008  . DYSPHAGIA 06/03/2008  . ABDOMINAL PAIN-EPIGASTRIC 06/03/2008    Past Surgical History:  Procedure Laterality Date  . ECTOPIC PREGNANCY SURGERY     x 2  . EYE SURGERY    . WISDOM TOOTH EXTRACTION      OB History   No obstetric history on file.      Home Medications    Prior to Admission medications   Medication Sig Start Date End Date Taking? Authorizing Provider  acetaminophen (TYLENOL) 325 MG tablet Take 650 mg by mouth every 6 (six) hours as needed.    [provider]  albuterol (PROVENTIL HFA;VENTOLIN HFA) 108 (90 Base) MCG/ACT inhaler Inhale 2 puffs into the lungs every 4 (four) hours as needed for wheezing (cough, shortness of breath or wheezing.). 11/26/16   Tanda Rockers, MD  BLACK ELDERBERRY,BERRY-FLOWER, PO Take by mouth.    [provider]  Cholecalciferol (VITAMIN D-3) 5000 units TABS Take 1 tablet by mouth daily.    [provider]  magnesium 30 MG tablet Take 30 mg by mouth 2 (two) times daily.    [provider]  Multiple Vitamins-Minerals (MULTIVITAMIN WITH MINERALS) tablet Take 1 tablet by mouth daily.    [provider]  Vitamin D, Ergocalciferol, (DRISDOL) 1.25 MG (50000 UT) CAPS capsule TAKE 1 CAPSULE (50,000 UNITS TOTAL) BY MOUTH EVERY 7 (SEVEN) DAYS. 07/25/18   Mellody Dance, DO  Zinc 100 MG TABS Take by mouth.  [provider]    Family History Family History  Problem Relation Age of Onset  . Thyroid disease Mother   . COPD Mother   . Cancer Father        lung  . Diabetes Father   . Hyperlipidemia Father   . Hypertension Father   . Stroke Father   . Thyroid disease Sister   . Diabetes Sister     Social History Social History   Tobacco Use  . Smoking status: Current Every Day Smoker    Packs/day: 1.00    Years: 40.00    Pack years: 40.00    Types: Cigarettes  .  Smokeless tobacco: Never Used  Substance Use Topics  . Alcohol use: No  . Drug use: No     Allergies   Moxifloxacin and Oxycodone-acetaminophen   Review of Systems Review of Systems  All other systems reviewed and are negative.    Physical Exam Triage Vital Signs ED Triage Vitals  Enc Vitals Group     BP 08/03/19 1144 (!) 177/90     Pulse Rate 08/03/19 1144 78     Resp 08/03/19 1144 18     Temp 08/03/19 1144 98 F (36.7 C)     Temp Source 08/03/19 1144 Oral     SpO2 08/03/19 1144 96 %     Weight --      Height --      Head Circumference --      Peak Flow --      Pain Score 08/03/19 1145 5     Pain Loc --      Pain Edu? --      Excl. in GC? --    No data found.  Updated Vital Signs BP (!) 177/90 (BP Location: Left Arm)   Pulse 78   Temp 98 F (36.7 C) (Oral)   Resp 18   SpO2 96%   Visual Acuity Right Eye Distance:   Left Eye Distance:   Bilateral Distance:    Right Eye Near:   Left Eye Near:    Bilateral Near:     Physical Exam Vitals and nursing note reviewed.  Constitutional:      Appearance: She is well-developed.  HENT:     Head: Normocephalic.     Comments: 3cm laceration occipital scalp gapping Cardiovascular:     Rate and Rhythm: Normal rate.  Pulmonary:     Effort: Pulmonary effort is normal.  Abdominal:     General: There is no distension.  Musculoskeletal:        General: Normal range of motion.     Cervical back: Normal range of motion.  Neurological:     Mental Status: She is alert and oriented to person, place, and time.      UC Treatments / Results  Labs (all labs ordered are listed, but only abnormal results are displayed) Labs Reviewed - No data to display  EKG   Radiology No results found.  Procedures Laceration Repair  Date/Time: 08/03/2019 12:07 PM Performed by: Elson Areas, PA-C Authorized by: Elson Areas, PA-C   Consent:    Consent obtained:  Verbal   Consent given by:  Patient   Risks  discussed:  Infection, need for additional repair, pain, poor cosmetic result and poor wound healing   Alternatives discussed:  No treatment and delayed treatment Universal protocol:    Procedure explained and questions answered to patient or proxy's satisfaction: yes     Relevant documents present  and verified: yes     Test results available and properly labeled: yes     Imaging studies available: yes     Required blood products, implants, devices, and special equipment available: yes     Site/side marked: yes     Immediately prior to procedure, a time out was called: yes     Patient identity confirmed:  Verbally with patient Anesthesia (see MAR for exact dosages):    Anesthesia method:  Local infiltration Laceration details:    Location:  Scalp   Scalp location:  Occipital   Length (cm):  3 Repair type:    Repair type:  Simple Pre-procedure details:    Preparation:  Patient was prepped and draped in usual sterile fashion Exploration:    Contaminated: no   Treatment:    Area cleansed with:  Betadine   Amount of cleaning:  Standard   Irrigation solution:  Sterile saline Skin repair:    Repair method:  Staples   Number of staples:  5 Approximation:    Approximation:  Loose Post-procedure details:    Patient tolerance of procedure:  Tolerated well, no immediate complications   (including critical care time)  Medications Ordered in UC Medications  Tdap (BOOSTRIX) injection 0.5 mL (0.5 mLs Intramuscular Given 08/03/19 1158)    Initial Impression / Assessment and Plan / UC Course  I have reviewed the triage vital signs and the nursing notes.  Pertinent labs & imaging results that were available during my care of the patient were reviewed by me and considered in my medical decision making (see chart for details).     MDM   Pt advised staple removal in 8 days.  Tylenol for pain  Final Clinical Impressions(s) / UC Diagnoses   Final diagnoses:  Laceration of occipital  scalp, initial encounter     Discharge Instructions     Staple removal in 8 days    ED Prescriptions    None     PDMP not reviewed this encounter.  An After Visit Summary was printed and given to the patient.    Elson AreasSofia, Leslie K, [place] JerseyPA-C 08/03/19 1208

## 2019-08-03 NOTE — ED Triage Notes (Signed)
Pt states she tripped and fell hitting a corner of a table. 2' Lac noted to back rt of head with bleeding controlled

## 2019-08-11 ENCOUNTER — Ambulatory Visit
Admission: EM | Admit: 2019-08-11 | Discharge: 2019-08-11 | Disposition: A | Discharge status: Home / Self Care | Payer: BC Managed Care – PPO | Attending: Emergency Medicine | Admitting: Emergency Medicine

## 2019-08-11 DIAGNOSIS — Z4802 Encounter for removal of sutures: Secondary | ICD-10-CM

## 2019-08-11 NOTE — ED Triage Notes (Signed)
Pt presents for removal of 5 staples to back of head.  Area is well healed, 5 staples removed.

## 2019-08-11 NOTE — ED Notes (Signed)
Patient able to ambulate independently  

## 2019-10-10 DIAGNOSIS — Z03818 Encounter for observation for suspected exposure to other biological agents ruled out: Secondary | ICD-10-CM | POA: Diagnosis not present

## 2019-10-10 DIAGNOSIS — R05 Cough: Secondary | ICD-10-CM | POA: Diagnosis not present

## 2019-10-10 DIAGNOSIS — J3489 Other specified disorders of nose and nasal sinuses: Secondary | ICD-10-CM | POA: Diagnosis not present

## 2019-10-11 DIAGNOSIS — R05 Cough: Secondary | ICD-10-CM | POA: Diagnosis not present

## 2019-10-11 DIAGNOSIS — J3489 Other specified disorders of nose and nasal sinuses: Secondary | ICD-10-CM | POA: Diagnosis not present

## 2019-11-06 ENCOUNTER — Telehealth: Payer: Self-pay | Admitting: Internal Medicine

## 2019-11-06 NOTE — Telephone Encounter (Signed)
Spoke with pt. She is needing records stating that she needs some records stating that she has COPD. Advised her that she would need to contact our medical records department for this. She verbalized understanding. Nothing further was needed.

## 2019-12-04 ENCOUNTER — Institutional Professional Consult (permissible substitution): Payer: BC Managed Care – PPO | Admitting: Internal Medicine

## 2019-12-21 ENCOUNTER — Ambulatory Visit (INDEPENDENT_AMBULATORY_CARE_PROVIDER_SITE_OTHER): Payer: BC Managed Care – PPO | Admitting: Pulmonary Disease

## 2019-12-21 ENCOUNTER — Other Ambulatory Visit: Payer: Self-pay

## 2019-12-21 ENCOUNTER — Encounter: Payer: Self-pay | Admitting: Pulmonary Disease

## 2019-12-21 VITALS — BP 122/76 | HR 90 | Temp 97.0°F | Ht 61.0 in | Wt 161.8 lb

## 2019-12-21 DIAGNOSIS — J449 Chronic obstructive pulmonary disease, unspecified: Secondary | ICD-10-CM

## 2019-12-21 DIAGNOSIS — R0602 Shortness of breath: Secondary | ICD-10-CM | POA: Diagnosis not present

## 2019-12-21 DIAGNOSIS — Z87898 Personal history of other specified conditions: Secondary | ICD-10-CM | POA: Diagnosis not present

## 2019-12-21 MED ORDER — BREO ELLIPTA 200-25 MCG/INH IN AEPB: 1.0000 | INHALATION_SPRAY | Freq: Every day | RESPIRATORY_TRACT | 1 refills | Status: DC

## 2019-12-21 MED ORDER — LEVOCETIRIZINE DIHYDROCHLORIDE 5 MG PO TABS: 5.0000 mg | ORAL_TABLET | Freq: Every evening | ORAL | 1 refills | Status: DC

## 2019-12-21 NOTE — Progress Notes (Signed)
Leslie Romero    [id number]    1956-06-04  Primary Care Physician:Anderson, Helene Kelp, [place]  Referring Physician: Mellody Dance, [address] [place],  [place] [id number]  Chief complaint:   Patient with a history of obstructive lung disease  HPI:  History of COPD Active smoker smokes about half a pack to a pack a day A lot of stress leads to her smoking Actively thinking about quitting  Has been on Symbicort, is used other inhalers in the past  Has a lot of nasal stuffiness, dryness  Did try to quit with Chantix in the past, Wellbutrin with poor results  She does snore She is concerned about obstructive sleep apnea but not sure about treatment at present  Outpatient Encounter Medications as of 12/21/2019  Medication Sig  . acetaminophen (TYLENOL) 325 MG tablet Take 650 mg by mouth every 6 (six) hours as needed.  Marland Kitchen albuterol (PROVENTIL HFA;VENTOLIN HFA) 108 (90 Base) MCG/ACT inhaler Inhale 2 puffs into the lungs every 4 (four) hours as needed for wheezing (cough, shortness of breath or wheezing.).  Marland Kitchen BLACK ELDERBERRY,BERRY-FLOWER, PO Take by mouth.  . Cholecalciferol (VITAMIN D-3) 5000 units TABS Take 1 tablet by mouth daily.  . Zinc 100 MG TABS Take by mouth.  . magnesium 30 MG tablet Take 30 mg by mouth 2 (two) times daily.  . Multiple Vitamins-Minerals (MULTIVITAMIN WITH MINERALS) tablet Take 1 tablet by mouth daily.  . Vitamin D, Ergocalciferol, (DRISDOL) 1.25 MG (50000 UT) CAPS capsule TAKE 1 CAPSULE (50,000 UNITS TOTAL) BY MOUTH EVERY 7 (SEVEN) DAYS. (Patient not taking: Reported on 12/21/2019)   Facility-Administered Encounter Medications as of 12/21/2019  Medication  . ipratropium (ATROVENT) nebulizer solution 0.5 mg    Allergies as of 12/21/2019 - Review Complete 12/21/2019  Allergen Reaction Noted  . Moxifloxacin Rash 04/15/2015  . Oxycodone-acetaminophen  11/03/2012    Past Medical History:  Diagnosis Date  . Allergy   . Depression   .  Hypertension     Past Surgical History:  Procedure Laterality Date  . ECTOPIC PREGNANCY SURGERY     x 2  . EYE SURGERY    . WISDOM TOOTH EXTRACTION      Family History  Problem Relation Age of Onset  . Thyroid disease Mother   . COPD Mother   . Cancer Father        lung  . Diabetes Father   . Hyperlipidemia Father   . Hypertension Father   . Stroke Father   . Thyroid disease Sister   . Diabetes Sister     Social History   Socioeconomic History  . Marital status: Married    Spouse name: Not on file  . Number of children: Not on file  . Years of education: Not on file  . Highest education level: Not on file  Occupational History  . Not on file  Tobacco Use  . Smoking status: Current Every Day Smoker    Packs/day: 1.00    Years: 40.00    Pack years: 40.00    Types: Cigarettes  . Smokeless tobacco: Never Used  Substance and Sexual Activity  . Alcohol use: No  . Drug use: No  . Sexual activity: Yes    Birth control/protection: None  Other Topics Concern  . Not on file  Social History Narrative  . Not on file   Social Determinants of Health   Financial Resource Strain:   . Difficulty of Paying Living  Expenses:   Food Insecurity:   . Worried About Programme researcher, broadcasting/film/video in the Last Year:   . Barista in the Last Year:   Transportation Needs:   . Freight forwarder (Medical):   Marland Kitchen Lack of Transportation (Non-Medical):   Physical Activity:   . Days of Exercise per Week:   . Minutes of Exercise per Session:   Stress:   . Feeling of Stress :   Social Connections:   . Frequency of Communication with Friends and Family:   . Frequency of Social Gatherings with Friends and Family:   . Attends Religious Services:   . Active Member of Clubs or Organizations:   . Attends Banker Meetings:   Marland Kitchen Marital Status:   Intimate Partner Violence:   . Fear of Current or Ex-Partner:   . Emotionally Abused:   Marland Kitchen Physically Abused:   . Sexually Abused:      Review of Systems  Respiratory: Positive for shortness of breath.   Psychiatric/Behavioral: Positive for sleep disturbance.    Vitals:   12/21/19 1501  BP: 122/76  Pulse: 90  Temp: (!) 97 F (36.1 C)  SpO2: 96%     Physical Exam  Constitutional: She appears well-developed.  Obese  HENT:  Head: Normocephalic and atraumatic.  Eyes: Pupils are equal, round, and reactive to light. Right eye exhibits no discharge.  Neck: No tracheal deviation present. No thyromegaly present.  Cardiovascular: Normal rate and regular rhythm.  Pulmonary/Chest: Effort normal and breath sounds normal. No respiratory distress. She has no wheezes. She has no rales. She exhibits no tenderness.    Data Reviewed: CT scan of the chest reviewed showing evidence of emphysema  PFT from 2018 significant for severe obstructive lung disease  Assessment:  Severe obstructive lung disease -Bronchodilator treatments -Albuterol use as needed  Shortness of breath -Related to severity of obstructive lung disease  History of snoring Concern for sleep apnea -Not sure about evaluation and treatment at present  Risk of progressive disease with her continuing to smoke was discussed extensively  Plan/Recommendations: Breo 200, 1 puff daily Albuterol as needed for rescue  Xyzal for nasal congestion  Follow-up in 6 weeks  Encouraged to actively work on quitting smoking   Call with significant concerns   Virl Diamond MD [facility] 12/21/2019, 3:26 PM  CC: Thomasene Lot, DO

## 2019-12-21 NOTE — Patient Instructions (Signed)
Leslie Romero is the preferred medication on their formulary Symbicort is not covered  Xyzal for nasal stuffiness and congestion  Think about sleep apnea and treatment -If you want to get studied  Follow-up in 6 weeks Sleep Apnea Sleep apnea is a condition in which breathing pauses or becomes shallow during sleep. Episodes of sleep apnea usually last 10 seconds or longer, and they may occur as many as 20 times an hour. Sleep apnea disrupts your sleep and keeps your body from getting the rest that it needs. This condition can increase your risk of certain health problems, including:  Heart attack.  Stroke.  Obesity.  Diabetes.  Heart failure.  Irregular heartbeat. What are the causes? There are three kinds of sleep apnea:  Obstructive sleep apnea. This kind is caused by a blocked or collapsed airway.  Central sleep apnea. This kind happens when the part of the brain that controls breathing does not send the correct signals to the muscles that control breathing.  Mixed sleep apnea. This is a combination of obstructive and central sleep apnea. The most common cause of this condition is a collapsed or blocked airway. An airway can collapse or become blocked if:  Your throat muscles are abnormally relaxed.  Your tongue and tonsils are larger than normal.  You are overweight.  Your airway is smaller than normal. What increases the risk? You are more likely to develop this condition if you:  Are overweight.  Smoke.  Have a smaller than normal airway.  Are elderly.  Are female.  Drink alcohol.  Take sedatives or tranquilizers.  Have a family history of sleep apnea. What are the signs or symptoms? Symptoms of this condition include:  Trouble staying asleep.  Daytime sleepiness and tiredness.  Irritability.  Loud snoring.  Morning headaches.  Trouble concentrating.  Forgetfulness.  Decreased interest in sex.  Unexplained sleepiness.  Mood  swings.  Personality changes.  Feelings of depression.  Waking up often during the night to urinate.  Dry mouth.  Sore throat. How is this diagnosed? This condition may be diagnosed with:  A medical history.  A physical exam.  A series of tests that are done while you are sleeping (sleep study). These tests are usually done in a sleep lab, but they may also be done at home. How is this treated? Treatment for this condition aims to restore normal breathing and to ease symptoms during sleep. It may involve managing health issues that can affect breathing, such as high blood pressure or obesity. Treatment may include:  Sleeping on your side.  Using a decongestant if you have nasal congestion.  Avoiding the use of depressants, including alcohol, sedatives, and narcotics.  Losing weight if you are overweight.  Making changes to your diet.  Quitting smoking.  Using a device to open your airway while you sleep, such as: ? An oral appliance. This is a custom-made mouthpiece that shifts your lower jaw forward. ? A continuous positive airway pressure (CPAP) device. This device blows air through a mask when you breathe out (exhale). ? A nasal expiratory positive airway pressure (EPAP) device. This device has valves that you put into each nostril. ? A bi-level positive airway pressure (BPAP) device. This device blows air through a mask when you breathe in (inhale) and breathe out (exhale).  Having surgery if other treatments do not work. During surgery, excess tissue is removed to create a wider airway. It is important to get treatment for sleep apnea. Without treatment, this condition can  lead to:  High blood pressure.  Coronary artery disease.  In men, an inability to achieve or maintain an erection (impotence).  Reduced thinking abilities. Follow these instructions at home: Lifestyle  Make any lifestyle changes that your health care provider recommends.  Eat a healthy,  well-balanced diet.  Take steps to lose weight if you are overweight.  Avoid using depressants, including alcohol, sedatives, and narcotics.  Do not use any products that contain nicotine or tobacco, such as cigarettes, e-cigarettes, and chewing tobacco. If you need help quitting, ask your health care provider. General instructions  Take over-the-counter and prescription medicines only as told by your health care provider.  If you were given a device to open your airway while you sleep, use it only as told by your health care provider.  If you are having surgery, make sure to tell your health care provider you have sleep apnea. You may need to bring your device with you.  Keep all follow-up visits as told by your health care provider. This is important. Contact a health care provider if:  The device that you received to open your airway during sleep is uncomfortable or does not seem to be working.  Your symptoms do not improve.  Your symptoms get worse. Get help right away if:  You develop: ? Chest pain. ? Shortness of breath. ? Discomfort in your back, arms, or stomach.  You have: ? Trouble speaking. ? Weakness on one side of your body. ? Drooping in your face. These symptoms may represent a serious problem that is an emergency. Do not wait to see if the symptoms will go away. Get medical help right away. Call your local emergency services (911 in the [place] Do not drive yourself to the hospital. Summary  Sleep apnea is a condition in which breathing pauses or becomes shallow during sleep.  The most common cause is a collapsed or blocked airway.  The goal of treatment is to restore normal breathing and to ease symptoms during sleep. This information is not intended to replace advice given to you by your health care provider. Make sure you discuss any questions you have with your health care provider. Document Revised: 01/17/2019 Document Reviewed: 03/28/2018 Elsevier  Patient Education  [employer].  just let us know and we will schedule the study for home sleep study

## 2020-01-12 ENCOUNTER — Other Ambulatory Visit: Payer: Self-pay | Admitting: Pulmonary Disease

## 2020-01-15 NOTE — Telephone Encounter (Signed)
Dr. Val Eagle, please advise if you have an alternate med pt can take as there was a comment received by pt's pharmacy that med was not covered.

## 2020-02-05 ENCOUNTER — Other Ambulatory Visit: Payer: Self-pay

## 2020-02-05 ENCOUNTER — Ambulatory Visit (INDEPENDENT_AMBULATORY_CARE_PROVIDER_SITE_OTHER): Payer: BC Managed Care – PPO | Admitting: Internal Medicine

## 2020-02-05 ENCOUNTER — Encounter: Payer: Self-pay | Admitting: Internal Medicine

## 2020-02-05 VITALS — BP 130/74 | HR 72 | Temp 98.4°F | Ht 61.5 in | Wt 161.8 lb

## 2020-02-05 DIAGNOSIS — F17210 Nicotine dependence, cigarettes, uncomplicated: Secondary | ICD-10-CM | POA: Diagnosis not present

## 2020-02-05 DIAGNOSIS — R918 Other nonspecific abnormal finding of lung field: Secondary | ICD-10-CM | POA: Diagnosis not present

## 2020-02-05 DIAGNOSIS — J449 Chronic obstructive pulmonary disease, unspecified: Secondary | ICD-10-CM

## 2020-02-05 NOTE — Progress Notes (Signed)
Subjective:     Patient ID: Leslie Romero, female   DOB: 09-14-1955,     MRN: [id number]    Brief patient profile: 41  yowf active smoker with tendency to coughing fits started in late elementary school and continued as adult but pattern has been sporadic and then  Noted indolent onset slowly progressive  doe x 2012 x fast walk  With MPNS on lung cancer screening study referred to pulmonary clinic 09/21/2016 by Dr   Sharee Holster    History of Present Illness  09/21/2016 1st [facility] office visit/ Leslie Romero   Chief Complaint  Patient presents with  . Pulmonary Consult    Referred by Dr. Sharee Holster.  Pt c/o "stabbing CP" at night when she lies down. She has noticed this over the past 6 months. She states she has been having SOB for the past 3 yrs "seems like I am SOB alot of the time".    cp's are migratory/ worse with coughing/  am phlegm x tsp mucoid / never bloody. Doe = MMRC1 = can walk nl pace, flat grade, can't hurry or go uphills or steps s sob   rec The key is to stop smoking completely before smoking completely stops you - it's not too late Plan A = Automatic = Bevespi Take 2 puffs first thing in am and then another 2 puffs about 12 hours later.  Plan B = Backup Only use your albuterol as a rescue medication   Return about 11/29/16 with CT and pfts      11/26/2016  f/u ov/Leslie Romero re:  GOLD III  On bevespi but not using bid  Chief Complaint  Patient presents with  . Follow-up    discuss PFT  no real change doe but no longer saba dep since on bevespi  rec Please see patient coordinator before you leave today  to schedule pulmonary rehab Plan A = Automatic = change to Symbicort 160 Take 2 puffs first thing in am and then another 2 puffs about 12 hours later.  Work on inhaler technique:    Plan B = Backup Only use your albuterol as a rescue medication   The key is to stop smoking completely before smoking completely stops you!       03/30/2017  f/u ov/Stephen Baruch re:  Copd GOLD III no longer  on symb or gerd rx  Chief Complaint  Patient presents with  . Follow-up    pt has noticed increased nonprod cough, but states she is doing well overall .    doe = MMRC2 = can't walk a nl pace on a flat grade s sob but does fine slow and flat eg shopping ok  Main problem is thrush on symbicort, but  worse breathing since stopped it and also using saba but not more than 2 pffs  daily, only if overdoes it / no noct need/ dry cough is a bit worse since stopped gerd rx rec GERD diet  If start coughing for any reason :  Try prilosec otc 20mg   Take 30-60 min before first meal of the day and Pepcid ac (famotidine) 20 mg one @  bedtime until cough is completely gone for at least a week without the need for cough suppression Change bevespi Take 2 puffs first thing in am and then another 2 puffs about 12 hours later.  Work on inhaler technique The key is to stop smoking completely before smoking completely stops you!     02/05/2020  f/u ov/Leslie Romero re:  COPD III  breo 200 x one dose only did not understand maint vs prns  No vaccine  Chief Complaint  Patient presents with  . Follow-up    SOB on exertion.   Dyspnea:  MMRC2 = can't walk a nl pace on a flat grade s sob but does fine slow and flat  Cough: none  Sleeping: ok flat bed variable positions  SABA use: avg once a day  02: none    No obvious day to day or daytime variability or assoc excess/ purulent sputum or mucus plugs or hemoptysis or cp or chest tightness, subjective wheeze or overt sinus or hb symptoms.   Sleeping as above without nocturnal  or early am exacerbation  of respiratory  c/o's or need for noct saba. Also denies any obvious fluctuation of symptoms with weather or environmental changes or other aggravating or alleviating factors except as outlined above   No unusual exposure hx or h/o childhood pna/ asthma or knowledge of premature birth.  Current Allergies, Complete Past Medical History, Past Surgical History, Family History,  and Social History were reviewed in [employer] record.  ROS  The following are not active complaints unless bolded Hoarseness, sore throat, dysphagia, dental problems, itching, sneezing,  nasal congestion or discharge of excess mucus or purulent secretions, ear ache,   fever, chills, sweats, unintended wt loss or wt gain, classically pleuritic or exertional cp,  orthopnea pnd or arm/hand swelling  or leg swelling, presyncope, palpitations, abdominal pain, anorexia, nausea, vomiting, diarrhea  or change in bowel habits or change in bladder habits, change in stools or change in urine, dysuria, hematuria,  rash, arthralgias, visual complaints, headache, numbness, weakness or ataxia or problems with walking or coordination,  change in mood or  memory.        Current Meds  Medication Sig  . acetaminophen (TYLENOL) 325 MG tablet Take 650 mg by mouth every 6 (six) hours as needed.  Marland Kitchen albuterol (PROVENTIL HFA;VENTOLIN HFA) 108 (90 Base) MCG/ACT inhaler Inhale 2 puffs into the lungs every 4 (four) hours as needed for wheezing (cough, shortness of breath or wheezing.).  Marland Kitchen BLACK ELDERBERRY,BERRY-FLOWER, PO Take by mouth.  . Cholecalciferol (VITAMIN D-3) 5000 units TABS Take 1 tablet by mouth daily.  . fluticasone furoate-vilanterol (BREO ELLIPTA) 200-25 MCG/INH AEPB Only used x 1   . levocetirizine (XYZAL) 5 MG tablet Take 1 tablet (5 mg total) by mouth every evening.  . magnesium 30 MG tablet Take 30 mg by mouth 2 (two) times daily.  . Multiple Vitamins-Minerals (MULTIVITAMIN WITH MINERALS) tablet Take 1 tablet by mouth daily.  . Vitamin D, Ergocalciferol, (DRISDOL) 1.25 MG (50000 UT) CAPS capsule TAKE 1 CAPSULE (50,000 UNITS TOTAL) BY MOUTH EVERY 7 (SEVEN) DAYS.  Marland Kitchen Zinc 100 MG TABS Take by mouth.   Current Facility-Administered Medications for the 02/05/20 encounter (Office Visit) with Nyoka Cowden, MD  Medication  . ipratropium (ATROVENT) nebulizer solution 0.5 mg                    Objective:   Physical Exam    02/05/2020        161  03/30/2017        161  11/26/2016        164   09/21/16 165 lb (74.8 kg)  09/09/16 164 lb 14.4 oz (74.8 kg)  08/26/16 164 lb (74.4 kg)     Vital signs reviewed  02/05/2020  - Note at rest 02 sats  94% on RA  amb wf quit somber somewhat hopeless affect      HEENT : pt wearing mask not removed for exam due to covid -19 concerns.    NECK :  without JVD/Nodes/TM/ nl carotid upstrokes bilaterally   LUNGS: no acc muscle use,  Nl contour chest  With distant insp/exp rhonchi bilaterally    CV:  RRR  no s3 or murmur or increase in P2, and no edema   ABD:  soft and nontender with nl inspiratory excursion in the supine position. No bruits or organomegaly appreciated, bowel sounds nl  MS:  Nl gait/ ext warm without deformities, calf tenderness, cyanosis or clubbing No obvious joint restrictions   SKIN: warm and dry without lesions    NEURO:  alert, approp, nl sensorium with  no motor or cerebellar deficits apparent.        Labs ordered 02/05/2020  :  allergy profile   alpha one AT phenotype     Assessment:

## 2020-02-05 NOTE — Patient Instructions (Addendum)
We will refer you  To [facility] for help with smoking cessation   Breo should be daily and improve the ability to exert and if not happy change trelegy one click am and if still not happy make an appt to switch over entirely to sprays.  Plan A = Automatic = Always=   Breo 200 one each am then rinse and gargle   Plan B = Backup (to supplement plan A, not to replace it) Only use your albuterol inhaler as a rescue medication to be used if you can't catch your breath by resting or doing a relaxed purse lip breathing pattern.  - The less you use it, the better it will work when you need it. - Ok to use the inhaler up to 2 puffs  every 4 hours if you must but call for appointment if use goes up over your usual need - Don't leave home without it !!  (think of it like the spare tire for your car)   I will have Kandice Robinsons NP call to get you started back in the lung cancer screening progam    Please remember to go to the lab department   for your tests - we will call you with the results when they are available.   Please schedule a follow up visit in 3 months but call sooner if needed

## 2020-02-06 ENCOUNTER — Encounter: Payer: Self-pay | Admitting: Internal Medicine

## 2020-02-06 LAB — CBC WITH DIFFERENTIAL/PLATELET
Basophils Absolute: 0.1 10*3/uL (ref 0.0–0.1)
Basophils Relative: 0.7 % (ref 0.0–3.0)
Eosinophils Absolute: 0.2 10*3/uL (ref 0.0–0.7)
Eosinophils Relative: 2.1 % (ref 0.0–5.0)
HCT: 41.6 % (ref 36.0–46.0)
Hemoglobin: 13.9 g/dL (ref 12.0–15.0)
Lymphocytes Relative: 35.8 % (ref 12.0–46.0)
Lymphs Abs: 3.2 10*3/uL (ref 0.7–4.0)
MCHC: 33.5 g/dL (ref 30.0–36.0)
MCV: 86.7 fl (ref 78.0–100.0)
Monocytes Absolute: 0.6 10*3/uL (ref 0.1–1.0)
Monocytes Relative: 7.2 % (ref 3.0–12.0)
Neutro Abs: 4.9 10*3/uL (ref 1.4–7.7)
Neutrophils Relative %: 54.2 % (ref 43.0–77.0)
Platelets: 274 10*3/uL (ref 150.0–400.0)
RBC: 4.8 Mil/uL (ref 3.87–5.11)
RDW: 13.7 % (ref 11.5–15.5)
WBC: 9 10*3/uL (ref 4.0–10.5)

## 2020-02-06 NOTE — Assessment & Plan Note (Signed)
Counseled re importance of smoking cessation but did not meet time criteria for separate billing    >>>Referred to [facility] behavioral [facility] ? Candidate for hypnosis?  But also has lots of fm issues with son with Down Syndrome very stressful          Each maintenance medication was reviewed in detail including emphasizing most importantly the difference between maintenance and prns and under what circumstances the prns are to be triggered using an action plan format where appropriate.  Total time for H and P, chart review, counseling, teaching device and generating customized AVS unique to this office visit / charting = 30 min

## 2020-02-06 NOTE — Assessment & Plan Note (Signed)
Started CT low dose screening 08/30/16  Lung-RADS Category 4A, suspicious. Follow up low-dose chest CT without contrast in 3 months  - Repeat CT 11/29/16 > no change, repeat in one year rec  - 02/05/2020   referred back for screening CTs > Leslie Romero   Discussed in detail all the  indications, usual  risks and alternatives  relative to the benefits with patient who agrees to proceed with w/u as outlined.

## 2020-02-06 NOTE — Assessment & Plan Note (Addendum)
Active Smoker Spirometry 09/21/2016  FEV1 1.02 (43%)  Ratio 52 s previous rx -  09/21/2016   try bevespi 2bid    - 11/26/2016  change to symbicort 160 - PFT's  11/26/2016  FEV1 1.13 (48 % ) ratio 55  p 29 % improvement from saba p nothing prior to study with DLCO  77/75 % corrects to 92 % for alv volume  - 03/30/2017   thrush on symb 160 > change back to bevespi  - 02/05/2020 trial of Breo 200 and if thrush or upper airway issues change to Trelegy 100    Group D in terms of symptom/risk and laba/lama/ICS  therefore appropriate rx at this point >>>  Probably needs to be on trelegy if tolerates but just purchased breo 200 so ok to use it first though I fear it may irritate mouth if not causing thrush > advised on gargling  - The proper method of use, as well as anticipated side effects, of a metered-dose inhaler and elipta  were discussed and demonstrated to the patient.     Pt informed of the seriousness of COVID 19 infection as a direct risk to lung health  and safey and to close contacts and should continue to wear a facemask in public and minimize exposure to public locations but especially avoid any area or activity where non-close contacts are not observing distancing or wearing an appropriate face mask.  I strongly recommended vaccine asap based on updated info on millions now vaccinated showing safety and effectiveness now against even the delta variant.

## 2020-02-08 ENCOUNTER — Other Ambulatory Visit: Payer: Self-pay | Admitting: Internal Medicine

## 2020-02-08 DIAGNOSIS — F17210 Nicotine dependence, cigarettes, uncomplicated: Secondary | ICD-10-CM

## 2020-02-11 ENCOUNTER — Telehealth: Payer: Self-pay | Admitting: Internal Medicine

## 2020-02-11 DIAGNOSIS — F17210 Nicotine dependence, cigarettes, uncomplicated: Secondary | ICD-10-CM

## 2020-02-11 NOTE — Telephone Encounter (Signed)
Spoke with pt and notified of results per Dr. Wert. Pt verbalized understanding and denied any questions. 

## 2020-02-11 NOTE — Telephone Encounter (Signed)
Spoke with pt. She is requesting lab results done on 02/05/20. Please advise.

## 2020-02-11 NOTE — Telephone Encounter (Signed)
I was waiting until everything came back but all looks normal so far and I will call if the last part is abnormal (phenotype for alpha one) and let her know if any issues but levels are nl so it will likely be nl also   No change recs either way

## 2020-02-13 ENCOUNTER — Other Ambulatory Visit: Payer: Self-pay | Admitting: Pulmonary Disease

## 2020-02-14 LAB — ALPHA-1 ANTITRYPSIN PHENOTYPE: A-1 Antitrypsin, Ser: 137 mg/dL (ref 83–199)

## 2020-02-14 LAB — IGE: IgE (Immunoglobulin E), Serum: 29 kU/L (ref <=114)

## 2020-02-15 ENCOUNTER — Other Ambulatory Visit: Payer: Self-pay | Admitting: Pulmonary Disease

## 2020-02-15 DIAGNOSIS — F17200 Nicotine dependence, unspecified, uncomplicated: Secondary | ICD-10-CM | POA: Diagnosis not present

## 2020-02-15 DIAGNOSIS — J449 Chronic obstructive pulmonary disease, unspecified: Secondary | ICD-10-CM | POA: Diagnosis not present

## 2020-02-15 DIAGNOSIS — F419 Anxiety disorder, unspecified: Secondary | ICD-10-CM | POA: Diagnosis not present

## 2020-02-15 NOTE — Progress Notes (Signed)
ATC, NA and the mailbox was full

## 2020-02-19 DIAGNOSIS — Z23 Encounter for immunization: Secondary | ICD-10-CM | POA: Diagnosis not present

## 2020-03-05 ENCOUNTER — Other Ambulatory Visit: Payer: Self-pay

## 2020-03-05 ENCOUNTER — Ambulatory Visit (INDEPENDENT_AMBULATORY_CARE_PROVIDER_SITE_OTHER): Payer: BC Managed Care – PPO | Admitting: Acute Care

## 2020-03-05 ENCOUNTER — Encounter: Payer: Self-pay | Admitting: Acute Care

## 2020-03-05 ENCOUNTER — Ambulatory Visit
Admission: RE | Admit: 2020-03-05 | Discharge: 2020-03-05 | Disposition: A | Discharge status: Home / Self Care | Payer: BC Managed Care – PPO | Source: Ambulatory Visit | Attending: Acute Care | Admitting: Acute Care

## 2020-03-05 DIAGNOSIS — F17210 Nicotine dependence, cigarettes, uncomplicated: Secondary | ICD-10-CM

## 2020-03-05 DIAGNOSIS — Z122 Encounter for screening for malignant neoplasm of respiratory organs: Secondary | ICD-10-CM

## 2020-03-05 NOTE — Assessment & Plan Note (Signed)
Shared decision Making visit 03/05/2020 Baseline scan done 2018 by Dr. Sherene Sires

## 2020-03-05 NOTE — Patient Instructions (Signed)
Thank you for participating in the [facility]. It was our pleasure to meet you today. We will call you with the results of your scan within the next few days. Your scan will be assigned a Lung RADS category score by the physicians reading the scans.  This Lung RADS score determines follow up scanning.  See below for description of categories, and follow up screening recommendations. We will be in touch to schedule your follow up screening annually or based on recommendations of our providers. We will fax a copy of your scan results to your Primary Care Physician, or the physician who referred you to the program, to ensure they have the results. Please call the office if you have any questions or concerns regarding your scanning experience or results.  Our office number is [phone]. Please speak with Denise Phelps, RN. She is our Lung Cancer Screening RN. If she is unavailable when you call, please have the office staff send her a message. She will return your call at her earliest convenience. Remember, if your scan is normal, we will scan you annually as long as you continue to meet the criteria for the program. (Age 55-77, Current smoker or smoker who has quit within the last 15 years). If you are a smoker, remember, quitting is the single most powerful action that you can take to decrease your risk of lung cancer and other pulmonary, breathing related problems. We know quitting is hard, and we are here to help.  Please let us know if there is anything we can do to help you meet your goal of quitting. If you are a former smoker, congratulations. We are proud of you! Remain smoke free! Remember you can refer friends or family members through the number above.  We will screen them to make sure they meet criteria for the program. Thank you for helping us take better care of you by participating in Lung Screening.  Lung RADS Categories:  Lung RADS 1: no nodules  or definitely non-concerning nodules.  Recommendation is for a repeat annual scan in 12 months.  Lung RADS 2:  nodules that are non-concerning in appearance and behavior with a very low likelihood of becoming an active cancer. Recommendation is for a repeat annual scan in 12 months.  Lung RADS 3: nodules that are probably non-concerning , includes nodules with a low likelihood of becoming an active cancer.  Recommendation is for a 6-month repeat screening scan. Often noted after an upper respiratory illness. We will be in touch to make sure you have no questions, and to schedule your 6-month scan.  Lung RADS 4 A: nodules with concerning findings, recommendation is most often for a follow up scan in 3 months or additional testing based on our provider's assessment of the scan. We will be in touch to make sure you have no questions and to schedule the recommended 3 month follow up scan.  Lung RADS 4 B:  indicates findings that are concerning. We will be in touch with you to schedule additional diagnostic testing based on our provider's  assessment of the scan.   

## 2020-03-05 NOTE — Progress Notes (Signed)
Shared Decision Making Visit Lung Cancer Screening Program [phone])   Eligibility:  Age 64 y.o.  Pack Years Smoking History Calculation 40 pack year smoking history (# packs/per year x # years smoked)  Recent History of coughing up blood  no  Unexplained weight loss? no ( >Than 15 pounds within the last 6 months )  Prior History Lung / other cancer no (Diagnosis within the last 5 years already requiring surveillance chest CT Scans).  Smoking Status Current Smoker  Former Smokers: Years since quit: NA  Quit Date: NA  Visit Components:  Discussion included one or more decision making aids. yes  Discussion included risk/benefits of screening. yes  Discussion included potential follow up diagnostic testing for abnormal scans. yes  Discussion included meaning and risk of over diagnosis. yes  Discussion included meaning and risk of False Positives. yes  Discussion included meaning of total radiation exposure. yes  Counseling Included:  Importance of adherence to annual lung cancer LDCT screening. yes  Impact of comorbidities on ability to participate in the program. yes  Ability and willingness to under diagnostic treatment. yes  Smoking Cessation Counseling:  Current Smokers:   Discussed importance of smoking cessation. yes  Information about tobacco cessation classes and interventions provided to patient. yes  Patient provided with "ticket" for LDCT Scan. yes  Symptomatic Patient. no  Counseling  Diagnosis Code: Tobacco Use Z72.0  Asymptomatic Patient yes  Counseling (Intermediate counseling: > three minutes counseling) C5852  Former Smokers:   Discussed the importance of maintaining cigarette abstinence. yes  Diagnosis Code: Personal History of Nicotine Dependence. D78.242  Information about tobacco cessation classes and interventions provided to patient. Yes  Patient provided with "ticket" for LDCT Scan. yes  Written Order for Lung Cancer  Screening with LDCT placed in Epic. Yes (CT Chest Lung Cancer Screening Low Dose W/O CM) [id number] Z12.2-Screening of respiratory organs Z87.891-Personal history of nicotine dependence  I have spent 25 minutes of face to face time with Ms. Nazar discussing the risks and benefits of lung cancer screening. We viewed a power point together that explained in detail the above noted topics. We paused at intervals to allow for questions to be asked and answered to ensure understanding.We discussed that the single most powerful action that she can take to decrease her risk of developing lung cancer is to quit smoking. We discussed whether or not she is ready to commit to setting a quit date. We discussed options for tools to aid in quitting smoking including nicotine replacement therapy, non-nicotine medications, support groups, Quit Smart classes, and behavior modification. We discussed that often times setting smaller, more achievable goals, such as eliminating 1 cigarette a day for a week and then 2 cigarettes a day for a week can be helpful in slowly decreasing the number of cigarettes smoked. This allows for a sense of accomplishment as well as providing a clinical benefit. I gave her the " Be Stronger Than Your Excuses" card with contact information for community resources, classes, free nicotine replacement therapy, and access to mobile apps, text messaging, and on-line smoking cessation help. I have also given her my card and contact information in the event she needs to contact me. We discussed the time and location of the scan, and that either Abigail Miyamoto RN or I will call with the results within 24-48 hours of receiving them. I have offered her  a copy of the power point we viewed  as a resource in the event they need reinforcement of  the concepts we discussed today in the office. The patient verbalized understanding of all of  the above and had no further questions upon leaving the office. They have my  contact information in the event they have any further questions.  I spent 3 minutes counseling on smoking cessation and the health risks of continued tobacco abuse.  I explained to the patient that there has been a high incidence of coronary artery disease noted on these exams. I explained that this is a non-gated exam therefore degree or severity cannot be determined. This patient is not on statin therapy. I have asked the patient to follow-up with their PCP regarding any incidental finding of coronary artery disease and management with diet or medication as their PCP  feels is clinically indicated. The patient verbalized understanding of the above and had no further questions upon completion of the visit.   Pt. Has a prescription from her PCP to start Wellbutrin. She has not set a quit date. I will send her my card. I have asked her to call if she needs additional help with smoking cessation.   Bevelyn Ngo, NP 03/05/2020

## 2020-03-06 NOTE — Progress Notes (Signed)
Please call patient and let them  know their  low dose Ct was read as a Lung RADS 2: nodules that are benign in appearance and behavior with a very low likelihood of becoming a clinically active cancer due to size or lack of growth. Recommendation per radiology is for a repeat LDCT in 12 months. .Please let them  know we will order and schedule their  annual screening scan for 02/2021. Please let them  know there was notation of CAD on their  scan.  Please remind the patient  that this is a non-gated exam therefore degree or severity of disease  cannot be determined. Please have them  follow up with their PCP regarding potential risk factor modification, dietary therapy or pharmacologic therapy if clinically indicated. Pt.  is  not currently on statin therapy. Please place order for annual  screening scan for  02/2021 and fax results to PCP. Thanks so much. 

## 2020-03-10 ENCOUNTER — Other Ambulatory Visit: Payer: Self-pay | Admitting: *Deleted

## 2020-03-10 DIAGNOSIS — F17210 Nicotine dependence, cigarettes, uncomplicated: Secondary | ICD-10-CM

## 2020-03-11 ENCOUNTER — Other Ambulatory Visit: Payer: Self-pay | Admitting: Pulmonary Disease

## 2020-04-01 DIAGNOSIS — Z23 Encounter for immunization: Secondary | ICD-10-CM | POA: Diagnosis not present

## 2020-05-13 ENCOUNTER — Ambulatory Visit: Payer: BC Managed Care – PPO | Admitting: Internal Medicine

## 2020-06-10 DIAGNOSIS — Z0000 Encounter for general adult medical examination without abnormal findings: Secondary | ICD-10-CM | POA: Diagnosis not present

## 2020-06-10 DIAGNOSIS — I700 Atherosclerosis of aorta: Secondary | ICD-10-CM | POA: Diagnosis not present

## 2020-06-11 DIAGNOSIS — R82998 Other abnormal findings in urine: Secondary | ICD-10-CM | POA: Diagnosis not present

## 2020-06-11 DIAGNOSIS — Z0000 Encounter for general adult medical examination without abnormal findings: Secondary | ICD-10-CM | POA: Diagnosis not present

## 2020-06-11 DIAGNOSIS — B351 Tinea unguium: Secondary | ICD-10-CM | POA: Diagnosis not present

## 2020-06-23 ENCOUNTER — Ambulatory Visit: Payer: BC Managed Care – PPO | Admitting: Internal Medicine

## 2020-06-23 NOTE — Progress Notes (Deleted)
Subjective:     Patient ID: Leslie Romero, female   DOB: 09-14-1955,     MRN: [id number]    Brief patient profile: 41  yowf active smoker with tendency to coughing fits started in late elementary school and continued as adult but pattern has been sporadic and then  Noted indolent onset slowly progressive  doe x 2012 x fast walk  With MPNS on lung cancer screening study referred to pulmonary clinic 09/21/2016 by Dr   Sharee Holster    History of Present Illness  09/21/2016 1st [facility] office visit/ Leslie Romero   Chief Complaint  Patient presents with  . Pulmonary Consult    Referred by Dr. Sharee Holster.  Pt c/o "stabbing CP" at night when she lies down. She has noticed this over the past 6 months. She states she has been having SOB for the past 3 yrs "seems like I am SOB alot of the time".    cp's are migratory/ worse with coughing/  am phlegm x tsp mucoid / never bloody. Doe = MMRC1 = can walk nl pace, flat grade, can't hurry or go uphills or steps s sob   rec The key is to stop smoking completely before smoking completely stops you - it's not too late Plan A = Automatic = Bevespi Take 2 puffs first thing in am and then another 2 puffs about 12 hours later.  Plan B = Backup Only use your albuterol as a rescue medication   Return about 11/29/16 with CT and pfts      11/26/2016  f/u ov/Leslie Romero re:  GOLD III  On bevespi but not using bid  Chief Complaint  Patient presents with  . Follow-up    discuss PFT  no real change doe but no longer saba dep since on bevespi  rec Please see patient coordinator before you leave today  to schedule pulmonary rehab Plan A = Automatic = change to Symbicort 160 Take 2 puffs first thing in am and then another 2 puffs about 12 hours later.  Work on inhaler technique:    Plan B = Backup Only use your albuterol as a rescue medication   The key is to stop smoking completely before smoking completely stops you!       03/30/2017  f/u ov/Leslie Romero re:  Copd GOLD III no longer  on symb or gerd rx  Chief Complaint  Patient presents with  . Follow-up    pt has noticed increased nonprod cough, but states she is doing well overall .    doe = MMRC2 = can't walk a nl pace on a flat grade s sob but does fine slow and flat eg shopping ok  Main problem is thrush on symbicort, but  worse breathing since stopped it and also using saba but not more than 2 pffs  daily, only if overdoes it / no noct need/ dry cough is a bit worse since stopped gerd rx rec GERD diet  If start coughing for any reason :  Try prilosec otc 20mg   Take 30-60 min before first meal of the day and Pepcid ac (famotidine) 20 mg one @  bedtime until cough is completely gone for at least a week without the need for cough suppression Change bevespi Take 2 puffs first thing in am and then another 2 puffs about 12 hours later.  Work on inhaler technique The key is to stop smoking completely before smoking completely stops you!     02/05/2020  f/u ov/Leslie Romero re:  COPD III  breo 200 x one dose only did not understand maint vs prns  No vaccine  Chief Complaint  Patient presents with  . Follow-up    SOB on exertion.   Dyspnea:  MMRC2 = can't walk a nl pace on a flat grade s sob but does fine slow and flat  Cough: none  Sleeping: ok flat bed variable positions  SABA use: avg once a day  02: none  rec We will refer you  To [facility] for help with smoking cessation  Breo should be daily and improve the ability to exert and if not happy change trelegy one click am and if still not happy make an appt to switch over entirely to sprays. Plan A = Automatic = Always=   Breo 200 one each am then rinse and gargle  Plan B = Backup (to supplement plan A, not to replace it) Only use your albuterol inhaler as a rescue medication  I will have Kandice Robinsons NP call to get you started back in the lung cancer screening progam   06/23/2020  f/u ov/Leslie Romero re:  No chief complaint on file.    Dyspnea:  *** Cough:  *** Sleeping: *** SABA use: *** 02: ***   No obvious day to day or daytime variability or assoc excess/ purulent sputum or mucus plugs or hemoptysis or cp or chest tightness, subjective wheeze or overt sinus or hb symptoms.   *** without nocturnal  or early am exacerbation  of respiratory  c/o's or need for noct saba. Also denies any obvious fluctuation of symptoms with weather or environmental changes or other aggravating or alleviating factors except as outlined above   No unusual exposure hx or h/o childhood pna/ asthma or knowledge of premature birth.  Current Allergies, Complete Past Medical History, Past Surgical History, Family History, and Social History were reviewed in [employer] record.  ROS  The following are not active complaints unless bolded Hoarseness, sore throat, dysphagia, dental problems, itching, sneezing,  nasal congestion or discharge of excess mucus or purulent secretions, ear ache,   fever, chills, sweats, unintended wt loss or wt gain, classically pleuritic or exertional cp,  orthopnea pnd or arm/hand swelling  or leg swelling, presyncope, palpitations, abdominal pain, anorexia, nausea, vomiting, diarrhea  or change in bowel habits or change in bladder habits, change in stools or change in urine, dysuria, hematuria,  rash, arthralgias, visual complaints, headache, numbness, weakness or ataxia or problems with walking or coordination,  change in mood or  memory.        No outpatient medications have been marked as taking for the 06/23/20 encounter (Appointment) with Nyoka Cowden, MD.   Current Facility-Administered Medications for the 06/23/20 encounter (Appointment) with Nyoka Cowden, MD  Medication  . ipratropium (ATROVENT) nebulizer solution 0.5 mg                      Objective:   Physical Exam   06/23/2020         *** 02/05/2020        161  03/30/2017        161  11/26/2016        164   09/21/16 165 lb (74.8 kg)  09/09/16  164 lb 14.4 oz (74.8 kg)  08/26/16 164 lb (74.4 kg)         distant insp/exp rhonchi bilaterally       Assessment:

## 2020-06-27 ENCOUNTER — Other Ambulatory Visit: Payer: Self-pay | Admitting: Internal Medicine

## 2020-06-27 DIAGNOSIS — R5381 Other malaise: Secondary | ICD-10-CM

## 2020-07-22 DIAGNOSIS — Z1212 Encounter for screening for malignant neoplasm of rectum: Secondary | ICD-10-CM | POA: Diagnosis not present

## 2020-07-23 DIAGNOSIS — B353 Tinea pedis: Secondary | ICD-10-CM | POA: Diagnosis not present

## 2020-07-23 DIAGNOSIS — L821 Other seborrheic keratosis: Secondary | ICD-10-CM | POA: Diagnosis not present

## 2020-07-23 DIAGNOSIS — B351 Tinea unguium: Secondary | ICD-10-CM | POA: Diagnosis not present

## 2020-07-23 DIAGNOSIS — L578 Other skin changes due to chronic exposure to nonionizing radiation: Secondary | ICD-10-CM | POA: Diagnosis not present

## 2020-08-13 ENCOUNTER — Other Ambulatory Visit: Payer: Self-pay | Admitting: Pulmonary Disease

## 2020-09-02 ENCOUNTER — Other Ambulatory Visit: Payer: Self-pay | Admitting: Pulmonary Disease

## 2020-09-06 ENCOUNTER — Other Ambulatory Visit: Payer: Self-pay | Admitting: Internal Medicine

## 2020-12-17 ENCOUNTER — Other Ambulatory Visit: Payer: Self-pay | Admitting: Internal Medicine

## 2021-01-08 ENCOUNTER — Other Ambulatory Visit: Payer: Self-pay | Admitting: Internal Medicine

## 2021-01-08 NOTE — Telephone Encounter (Signed)
Pt will need an appt for further refills

## 2021-01-15 DIAGNOSIS — J0100 Acute maxillary sinusitis, unspecified: Secondary | ICD-10-CM | POA: Diagnosis not present

## 2021-01-15 DIAGNOSIS — R058 Other specified cough: Secondary | ICD-10-CM | POA: Diagnosis not present

## 2021-02-04 ENCOUNTER — Other Ambulatory Visit: Payer: Self-pay | Admitting: Internal Medicine

## 2021-03-16 ENCOUNTER — Other Ambulatory Visit: Payer: Self-pay | Admitting: *Deleted

## 2021-03-16 DIAGNOSIS — F17210 Nicotine dependence, cigarettes, uncomplicated: Secondary | ICD-10-CM

## 2021-04-22 ENCOUNTER — Ambulatory Visit
Admission: RE | Admit: 2021-04-22 | Discharge: 2021-04-22 | Disposition: A | Discharge status: Home / Self Care | Payer: BC Managed Care – PPO | Source: Ambulatory Visit | Attending: Acute Care | Admitting: Acute Care

## 2021-04-22 ENCOUNTER — Other Ambulatory Visit: Payer: Self-pay

## 2021-04-22 DIAGNOSIS — F17210 Nicotine dependence, cigarettes, uncomplicated: Secondary | ICD-10-CM | POA: Diagnosis not present

## 2021-05-01 ENCOUNTER — Encounter: Payer: Self-pay | Admitting: *Deleted

## 2021-05-01 DIAGNOSIS — F17210 Nicotine dependence, cigarettes, uncomplicated: Secondary | ICD-10-CM

## 2021-05-03 ENCOUNTER — Encounter: Payer: Self-pay | Admitting: Emergency Medicine

## 2021-05-03 ENCOUNTER — Ambulatory Visit
Admission: EM | Admit: 2021-05-03 | Discharge: 2021-05-03 | Disposition: A | Discharge status: Home / Self Care | Payer: BC Managed Care – PPO | Attending: Urgent Care | Admitting: Urgent Care

## 2021-05-03 ENCOUNTER — Other Ambulatory Visit: Payer: Self-pay

## 2021-05-03 ENCOUNTER — Ambulatory Visit (INDEPENDENT_AMBULATORY_CARE_PROVIDER_SITE_OTHER): Payer: BC Managed Care – PPO

## 2021-05-03 DIAGNOSIS — R509 Fever, unspecified: Secondary | ICD-10-CM

## 2021-05-03 DIAGNOSIS — B9789 Other viral agents as the cause of diseases classified elsewhere: Secondary | ICD-10-CM | POA: Diagnosis not present

## 2021-05-03 DIAGNOSIS — J449 Chronic obstructive pulmonary disease, unspecified: Secondary | ICD-10-CM

## 2021-05-03 DIAGNOSIS — F17200 Nicotine dependence, unspecified, uncomplicated: Secondary | ICD-10-CM

## 2021-05-03 DIAGNOSIS — J988 Other specified respiratory disorders: Secondary | ICD-10-CM | POA: Diagnosis not present

## 2021-05-03 DIAGNOSIS — R059 Cough, unspecified: Secondary | ICD-10-CM | POA: Diagnosis not present

## 2021-05-03 DIAGNOSIS — Z20822 Contact with and (suspected) exposure to covid-19: Secondary | ICD-10-CM | POA: Diagnosis not present

## 2021-05-03 DIAGNOSIS — R0981 Nasal congestion: Secondary | ICD-10-CM

## 2021-05-03 DIAGNOSIS — R0689 Other abnormalities of breathing: Secondary | ICD-10-CM

## 2021-05-03 MED ORDER — LEVOCETIRIZINE DIHYDROCHLORIDE 5 MG PO TABS: 5.0000 mg | ORAL_TABLET | Freq: Every evening | ORAL | 0 refills | Status: DC

## 2021-05-03 MED ORDER — PREDNISONE 20 MG PO TABS: ORAL_TABLET | ORAL | 0 refills | Status: DC

## 2021-05-03 MED ORDER — BENZONATATE 100 MG PO CAPS: 100.0000 mg | ORAL_CAPSULE | Freq: Three times a day (TID) | ORAL | 0 refills | Status: DC | PRN

## 2021-05-03 MED ORDER — PROMETHAZINE-DM 6.25-15 MG/5ML PO SYRP: 5.0000 mL | ORAL_SOLUTION | Freq: Every evening | ORAL | 0 refills | Status: DC | PRN

## 2021-05-03 NOTE — ED Triage Notes (Signed)
Pt here for cough and nasal congestion x 4 days; pt sts some body aches

## 2021-05-03 NOTE — Discharge Instructions (Addendum)
We will notify you of your COVID-19 test results as they arrive and may take between 48-72 hours.  I encourage you to sign up for MyChart if you have not already done so as this can be the easiest way for [place] to communicate results to you online or through a phone app.  Generally, we only contact you if it is a positive COVID result.  In the meantime, if you develop worsening symptoms including fever, chest pain, shortness of breath despite our current treatment plan then please report to the emergency room as this may be a sign of worsening status from possible COVID-19 infection.  Otherwise, we will manage this as a viral syndrome. For sore throat or cough try using a honey-based tea. Use 3 teaspoons of honey with juice squeezed from half lemon. Place shaved pieces of ginger into 1/2-1 cup of water and warm over stove top. Then mix the ingredients and repeat every 4 hours as needed. Please take Tylenol 500mg -650mg  every 6 hours for aches and pains, fevers. Hydrate very well with at least 2 liters of water. Eat light meals such as soups to replenish electrolytes and soft fruits, veggies. Start an antihistamine like Zyrtec, Allegra or Claritin for postnasal drainage, sinus congestion.  Take the oral prednisone to help with your cough, breathing given your history of COPD, smoking. Use your breathing treatments for the same as prescribed.

## 2021-05-03 NOTE — ED Provider Notes (Signed)
[facility]   MRN: [id number] DOB: July 20, 1956  Subjective:   Leslie Romero is a 65 y.o. female with past medical history of COPD III, currently smokes presenting for 4 day history of sinus congestion, sinus pressure, coughing. Needs to make sure she does not have COVID 19.    Current Facility-Administered Medications:    ipratropium (ATROVENT) nebulizer solution 0.5 mg, 0.5 mg, Nebulization, Once, Le, Thao P, DO  Current Outpatient Medications:    acetaminophen (TYLENOL) 325 MG tablet, Take 650 mg by mouth every 6 (six) hours as needed., Disp: , Rfl:    albuterol (PROVENTIL HFA;VENTOLIN HFA) 108 (90 Base) MCG/ACT inhaler, Inhale 2 puffs into the lungs every 4 (four) hours as needed for wheezing (cough, shortness of breath or wheezing.)., Disp: 1 Inhaler, Rfl: 4   BLACK ELDERBERRY,BERRY-FLOWER, PO, Take by mouth., Disp: , Rfl:    BREO ELLIPTA 200-25 MCG/INH AEPB, INHALE 1 PUFF BY MOUTH EVERY DAY, Disp: 60 each, Rfl: 0   Cholecalciferol (VITAMIN D-3) 5000 units TABS, Take 1 tablet by mouth daily., Disp: , Rfl:    levocetirizine (XYZAL) 5 MG tablet, TAKE 1 TABLET BY MOUTH EVERY DAY IN THE EVENING, Disp: 30 tablet, Rfl: 1   magnesium 30 MG tablet, Take 30 mg by mouth 2 (two) times daily., Disp: , Rfl:    Multiple Vitamins-Minerals (MULTIVITAMIN WITH MINERALS) tablet, Take 1 tablet by mouth daily., Disp: , Rfl:    Vitamin D, Ergocalciferol, (DRISDOL) 1.25 MG (50000 UT) CAPS capsule, TAKE 1 CAPSULE (50,000 UNITS TOTAL) BY MOUTH EVERY 7 (SEVEN) DAYS., Disp: 12 capsule, Rfl: 0   Zinc 100 MG TABS, Take by mouth., Disp: , Rfl:    Allergies  Allergen Reactions   Moxifloxacin Rash    Past Medical History:  Diagnosis Date   Allergy    Depression    Hypertension      Past Surgical History:  Procedure Laterality Date   ECTOPIC PREGNANCY SURGERY     x 2   EYE SURGERY     WISDOM TOOTH EXTRACTION      Family History  Problem Relation Age of Onset   Thyroid disease Mother     COPD Mother    Cancer Father        lung   Diabetes Father    Hyperlipidemia Father    Hypertension Father    Stroke Father    Thyroid disease Sister    Diabetes Sister     Social History   Tobacco Use   Smoking status: Every Day    Packs/day: 1.00    Years: 40.00    Pack years: 40.00    Types: Cigarettes   Smokeless tobacco: Never  Substance Use Topics   Alcohol use: No   Drug use: No    ROS   Objective:   Vitals: BP (!) 151/79 (BP Location: Right Arm)   Pulse 92   Temp 98.6 F (37 C) (Oral)   Resp 18   SpO2 96%   Physical Exam Constitutional:      General: She is not in acute distress.    Appearance: Normal appearance. She is well-developed. She is not ill-appearing, toxic-appearing or diaphoretic.  HENT:     Head: Normocephalic and atraumatic.     Right Ear: External ear normal.     Left Ear: External ear normal.     Nose: Nose normal.     Mouth/Throat:     Mouth: Mucous membranes are moist.  Eyes:     Extraocular  Movements: Extraocular movements intact.     Pupils: Pupils are equal, round, and reactive to light.  Cardiovascular:     Rate and Rhythm: Normal rate and regular rhythm.     Pulses: Normal pulses.     Heart sounds: Normal heart sounds. No murmur heard.   No friction rub. No gallop.  Pulmonary:     Effort: Pulmonary effort is normal. No respiratory distress.     Breath sounds: No stridor. No wheezing, rhonchi or rales.     Comments: Decreased lung sounds throughout.  Skin:    General: Skin is warm and dry.     Findings: No rash.  Neurological:     Mental Status: She is alert and oriented to person, place, and time.  Psychiatric:        Mood and Affect: Mood normal.        Behavior: Behavior normal.        Thought Content: Thought content normal.   Over-read is pending.   Assessment and Plan :   PDMP not reviewed this encounter.  1. Viral respiratory infection   2. Encounter for screening laboratory testing for COVID-19  virus   3. Cough   4. Sinus congestion   5. Smoker   6. Chronic obstructive pulmonary disease, unspecified COPD type (HCC)   7. Decreased lung sounds     Patient requested an antibiotic for her sinuses.  I counseled against this discussing with her antibiotic stewardship.  We will notify her of any abnormal results from her radiology overread.  Otherwise we will not contact her for abnormal chest x-ray.  Recommended an oral prednisone course given her history of COPD III and current smoking, decreased lung sounds on exam. Maintain breathing treatments as prescribed. Otherwise, will manage for viral illness such as viral URI, viral syndrome, viral rhinitis, COVID-19. Counseled patient on nature of COVID-19 including modes of transmission, diagnostic testing, management and supportive care.  Offered scripts for symptomatic relief. COVID 19 testing is pending. Counseled patient on potential for adverse effects with medications prescribed/recommended today, ER and return-to-clinic precautions discussed, patient verbalized understanding.     Wallis Bamberg, [place] 05/03/21 [phone]

## 2021-05-04 LAB — SARS-COV-2, NAA 2 DAY TAT

## 2021-05-04 LAB — NOVEL CORONAVIRUS, NAA: SARS-CoV-2, NAA: NOT DETECTED

## 2021-05-26 NOTE — Progress Notes (Signed)
Result letter sent by Abigail Miyamoto RN on 05/01/2021 Lung RADS 2: nodules that are benign in appearance and behavior with a very low likelihood of becoming a clinically active cancer due to size or lack of growth. Recommendation per radiology is for a repeat LDCT in 12 months.

## 2021-06-16 DIAGNOSIS — E785 Hyperlipidemia, unspecified: Secondary | ICD-10-CM | POA: Diagnosis not present

## 2021-06-19 DIAGNOSIS — Z0000 Encounter for general adult medical examination without abnormal findings: Secondary | ICD-10-CM | POA: Diagnosis not present

## 2021-06-19 DIAGNOSIS — Z1339 Encounter for screening examination for other mental health and behavioral disorders: Secondary | ICD-10-CM | POA: Diagnosis not present

## 2021-06-19 DIAGNOSIS — Z1331 Encounter for screening for depression: Secondary | ICD-10-CM | POA: Diagnosis not present

## 2021-06-19 DIAGNOSIS — Z23 Encounter for immunization: Secondary | ICD-10-CM | POA: Diagnosis not present

## 2021-06-19 DIAGNOSIS — J449 Chronic obstructive pulmonary disease, unspecified: Secondary | ICD-10-CM | POA: Diagnosis not present

## 2021-06-22 DIAGNOSIS — E785 Hyperlipidemia, unspecified: Secondary | ICD-10-CM | POA: Diagnosis not present

## 2021-07-01 ENCOUNTER — Other Ambulatory Visit: Payer: Self-pay | Admitting: Internal Medicine

## 2021-07-01 DIAGNOSIS — Z1231 Encounter for screening mammogram for malignant neoplasm of breast: Secondary | ICD-10-CM

## 2021-07-07 ENCOUNTER — Ambulatory Visit
Admission: RE | Admit: 2021-07-07 | Discharge: 2021-07-07 | Disposition: A | Discharge status: Home / Self Care | Payer: BC Managed Care – PPO | Source: Ambulatory Visit | Attending: Internal Medicine | Admitting: Internal Medicine

## 2021-07-07 DIAGNOSIS — Z1231 Encounter for screening mammogram for malignant neoplasm of breast: Secondary | ICD-10-CM

## 2021-07-23 ENCOUNTER — Encounter: Payer: Self-pay | Admitting: Cardiology

## 2021-07-23 ENCOUNTER — Ambulatory Visit: Payer: BC Managed Care – PPO | Admitting: Cardiology

## 2021-07-23 ENCOUNTER — Other Ambulatory Visit: Payer: Self-pay

## 2021-07-23 VITALS — BP 134/74 | HR 83 | Temp 97.8°F | Resp 16 | Ht 61.5 in | Wt 164.4 lb

## 2021-07-23 DIAGNOSIS — J432 Centrilobular emphysema: Secondary | ICD-10-CM | POA: Diagnosis not present

## 2021-07-23 DIAGNOSIS — R072 Precordial pain: Secondary | ICD-10-CM | POA: Diagnosis not present

## 2021-07-23 DIAGNOSIS — E7800 Pure hypercholesterolemia, unspecified: Secondary | ICD-10-CM

## 2021-07-23 DIAGNOSIS — R0609 Other forms of dyspnea: Secondary | ICD-10-CM

## 2021-07-23 DIAGNOSIS — R0789 Other chest pain: Secondary | ICD-10-CM | POA: Diagnosis not present

## 2021-07-23 DIAGNOSIS — I700 Atherosclerosis of aorta: Secondary | ICD-10-CM

## 2021-07-23 DIAGNOSIS — F17210 Nicotine dependence, cigarettes, uncomplicated: Secondary | ICD-10-CM | POA: Diagnosis not present

## 2021-07-23 MED ORDER — NICOTINE 14 MG/24HR TD PT24: 14.0000 mg | MEDICATED_PATCH | Freq: Every day | TRANSDERMAL | 0 refills | Status: DC

## 2021-07-23 MED ORDER — ASPIRIN EC 81 MG PO TBEC
81.0000 mg | DELAYED_RELEASE_TABLET | Freq: Every day | ORAL | 3 refills | Status: AC
Start: 2021-07-23 — End: ?

## 2021-07-23 MED ORDER — NICOTINE 7 MG/24HR TD PT24: 7.0000 mg | MEDICATED_PATCH | Freq: Every day | TRANSDERMAL | 0 refills | Status: AC

## 2021-07-23 NOTE — Progress Notes (Signed)
Primary Physician/Referring:  Sueanne Margarita, DO  Patient ID: Leslie Romero, female    DOB: [birth year], 65 y.o.   MRN: [id number]  Chief Complaint  Patient presents with   Chest discomfort   New Patient (Initial Visit)    Referred by Sueanne Margarita, DO   HPI:    Leslie Romero  is a 65 y.o.  Caucasian female with centrilobular emphysema, tobacco use disorder, lung nodule being followed annually for lung cancer screening, referred to me for evaluation of precordial chest discomfort.  Symptoms started about 6 months to 8 months ago, has been gradually noticing more often.  Comes with or without exertion activity however states her physical activity is low due to dyspnea.  She also has chronic dyspnea on exertion that has been ongoing for years and has been gradually getting worse.  Unfortunately still smoking.  Past Medical History:  Diagnosis Date   Allergy    Depression    Hypertension    Past Surgical History:  Procedure Laterality Date   ECTOPIC PREGNANCY SURGERY     x 2   EYE SURGERY     WISDOM TOOTH EXTRACTION     Family History  Problem Relation Age of Onset   Thyroid disease Mother    COPD Mother    Cancer Father        lung   Diabetes Father    Hyperlipidemia Father    Hypertension Father    Stroke Father    Thyroid disease Sister    Diabetes Sister    Heart attack Brother    Drug abuse Brother        Pain killer addiction   Other Son        Musician accident   Breast cancer Neg Hx     Social History   Tobacco Use   Smoking status: Every Day    Packs/day: 1.00    Years: 40.00    Pack years: 40.00    Types: Cigarettes   Smokeless tobacco: Never  Substance Use Topics   Alcohol use: No   Marital Status: Married  ROS  Review of Systems  Cardiovascular:  Positive for chest pain and dyspnea on exertion. Negative for leg swelling.  Gastrointestinal:  Negative for melena.  Psychiatric/Behavioral:  Positive for depression.   Objective  Blood pressure  134/74, pulse 83, temperature 97.8 F (36.6 C), temperature source Temporal, resp. rate 16, height 5' 1.5" (1.562 m), weight 164 lb 6.4 oz (74.6 kg), SpO2 95 %. Body mass index is 30.56 kg/m.  Vitals with BMI 07/23/2021 05/03/2021 02/05/2020  Height 5' 1.5" - 5' 1.5"  Weight 164 lbs 6 oz - 161 lbs 13 oz  BMI 94.50 - 38.88  Systolic 280 034 917  Diastolic 74 79 74  Pulse 83 92 72    Physical Exam Constitutional:      Appearance: She is obese.  Neck:     Vascular: No carotid bruit or JVD.  Cardiovascular:     Rate and Rhythm: Normal rate and regular rhythm.     Pulses: Intact distal pulses.     Heart sounds: Normal heart sounds. No murmur heard.   No gallop.  Pulmonary:     Effort: Pulmonary effort is normal.     Breath sounds: Normal breath sounds.  Abdominal:     General: Bowel sounds are normal.     Palpations: Abdomen is soft.  Musculoskeletal:        General: No swelling.  Laboratory examination:   External labs:   Labs 06/22/2021:  CHOLESTEROL 214   100 - 200 TRIGLYCERIDES 116   30 - 149 HDL   47   40 - 60 LDL   144   < CHOL/HDL RATIO 4.6   < LDL/HDL RATIO 3.1   1.7 - 2.5  GLUCOSE  98   60 - 110 BUN   10   5 - 23 CREATININE  0.7   0.3 - 1.5 eGFR Non-African American 84.2   < SODIUM  138   135 - 148 POTASSIUM  4.6   3.5 - 5.3 AST   19   7 - 45 ALT   19   5 - 40  HGB 15.1   12.0 - 18.0 HCT 46.5   37.0 - 51.0 MCV 86.1   80.0 - 97.0 MCH 28.0   26.0 - 32.0 MCHC 32.5   31.0 - 36.0 RDW 12.5   12.3 - 15.4 PLT 289   140 - 440  TSH    2.66   0.40 - 4.20  Medications and allergies   Allergies  Allergen Reactions   Moxifloxacin Rash     Medication prior to this encounter:   Outpatient Medications Prior to Visit  Medication Sig Dispense Refill   acetaminophen (TYLENOL) 325 MG tablet Take 650 mg by mouth every 6 (six) hours as needed.     albuterol (PROVENTIL HFA;VENTOLIN HFA) 108 (90 Base) MCG/ACT inhaler Inhale 2 puffs into the lungs every 4 (four)  hours as needed for wheezing (cough, shortness of breath or wheezing.). 1 Inhaler 4   BLACK ELDERBERRY,BERRY-FLOWER, PO Take by mouth.     BREO ELLIPTA 200-25 MCG/INH AEPB INHALE 1 PUFF BY MOUTH EVERY DAY 60 each 0   Cholecalciferol (VITAMIN D-3) 5000 units TABS Take 1 tablet by mouth daily.     levocetirizine (XYZAL) 5 MG tablet Take 1 tablet (5 mg total) by mouth every evening. 90 tablet 0   Zinc 100 MG TABS Take by mouth.     magnesium 30 MG tablet Take 30 mg by mouth 2 (two) times daily. (Patient not taking: Reported on 07/23/2021)     benzonatate (TESSALON) 100 MG capsule Take 1-2 capsules (100-200 mg total) by mouth 3 (three) times daily as needed for cough. 60 capsule 0   Multiple Vitamins-Minerals (MULTIVITAMIN WITH MINERALS) tablet Take 1 tablet by mouth daily.     predniSONE (DELTASONE) 20 MG tablet Take 2 tablets daily with breakfast. 10 tablet 0   promethazine-dextromethorphan (PROMETHAZINE-DM) 6.25-15 MG/5ML syrup Take 5 mLs by mouth at bedtime as needed for cough. 100 mL 0   Vitamin D, Ergocalciferol, (DRISDOL) 1.25 MG (50000 UT) CAPS capsule TAKE 1 CAPSULE (50,000 UNITS TOTAL) BY MOUTH EVERY 7 (SEVEN) DAYS. 12 capsule 0   Facility-Administered Medications Prior to Visit  Medication Dose Route Frequency Provider Last Rate Last Admin   ipratropium (ATROVENT) nebulizer solution 0.5 mg  0.5 mg Nebulization Once Le, Thao P, DO         Medication list after today's encounter   Current Outpatient Medications  Medication Instructions   acetaminophen (TYLENOL) 650 mg, Oral, Every 6 hours PRN   albuterol (PROVENTIL HFA;VENTOLIN HFA) 108 (90 Base) MCG/ACT inhaler 2 puffs, Inhalation, Every 4 hours PRN   aspirin EC 81 mg, Oral, Daily   BLACK ELDERBERRY,BERRY-FLOWER, PO Oral   BREO ELLIPTA 200-25 MCG/INH AEPB INHALE 1 PUFF BY MOUTH EVERY DAY   Cholecalciferol (VITAMIN D-3) 5000 units TABS 1 tablet, Oral, Daily  levocetirizine (XYZAL) 5 mg, Oral, Every evening   magnesium 30 mg, 2 times  daily   nicotine (NICODERM CQ - DOSED IN MG/24 HOURS) 14 mg, Transdermal, Daily   [START ON 08/24/2021] nicotine (NICODERM CQ) 7 mg, Transdermal, Daily, Start after 14 mg Rx is used up   Zinc 100 MG TABS Oral    Radiology:   CT chest for lung cancer screening 04/22/2021: Cardiovascular: Aortic atherosclerosis. Normal heart size, without pericardial effusion.   Mediastinum/Nodes: No mediastinal or definite hilar adenopathy, given limitations of unenhanced CT.   Lungs/Pleura: No pleural fluid. Mild centrilobular emphysema. Bibasilar scarring. Posterior right upper lobe linear density is decreased, likely evolving scar. There is also a Peri fissural left lower lobe nodule of volume derived equivalent diameter 1.6 mm which is unchanged.  Cardiac Studies:   NA  EKG:   EKG 07/23/2021: Normal sinus rhythm at rate of 72 bpm, left axis deviation, left anterior fascicle block.  No evidence of ischemia.    Assessment     ICD-10-CM   1. Precordial chest pain  R07.2 EKG 12-Lead    PCV ECHOCARDIOGRAM COMPLETE    PCV MYOCARDIAL PERFUSION WITH LEXISCAN    2. Dyspnea on exertion  R06.09 PCV ECHOCARDIOGRAM COMPLETE    PCV MYOCARDIAL PERFUSION WITH LEXISCAN    3. Aortic atherosclerosis (HCC)  I70.0 PCV ECHOCARDIOGRAM COMPLETE    PCV MYOCARDIAL PERFUSION WITH LEXISCAN    aspirin EC 81 MG tablet    4. Hypercholesteremia  E78.00     5. Moderate smoker (20 or less per day)  F17.210 nicotine (NICODERM CQ - DOSED IN MG/24 HOURS) 14 mg/24hr patch    nicotine (NICODERM CQ) 7 mg/24hr patch    6. Centrilobular emphysema (HCC)  J43.2 nicotine (NICODERM CQ - DOSED IN MG/24 HOURS) 14 mg/24hr patch    nicotine (NICODERM CQ) 7 mg/24hr patch       Medications Discontinued During This Encounter  Medication Reason   benzonatate (TESSALON) 100 MG capsule    Multiple Vitamins-Minerals (MULTIVITAMIN WITH MINERALS) tablet    predniSONE (DELTASONE) 20 MG tablet    promethazine-dextromethorphan  (PROMETHAZINE-DM) 6.25-15 MG/5ML syrup    Vitamin D, Ergocalciferol, (DRISDOL) 1.25 MG (50000 UT) CAPS capsule     Meds ordered this encounter  Medications   aspirin EC 81 MG tablet    Sig: Take 1 tablet (81 mg total) by mouth daily.    Dispense:  90 tablet    Refill:  3   nicotine (NICODERM CQ - DOSED IN MG/24 HOURS) 14 mg/24hr patch    Sig: Place 1 patch (14 mg total) onto the skin daily.    Dispense:  28 patch    Refill:  0   nicotine (NICODERM CQ) 7 mg/24hr patch    Sig: Place 1 patch (7 mg total) onto the skin daily. Start after 14 mg Rx is used up    Dispense:  28 patch    Refill:  0   Orders Placed This Encounter  Procedures   PCV MYOCARDIAL PERFUSION WITH LEXISCAN    Standing Status:   Future    Standing Expiration Date:   09/23/2021   EKG 12-Lead   PCV ECHOCARDIOGRAM COMPLETE    Standing Status:   Future    Standing Expiration Date:   07/23/2022   Recommendations:   ANABIA WEATHERWAX is a 65 y.o. Caucasian female with centrilobular emphysema, tobacco use disorder, lung nodule being followed annually for lung cancer screening, referred to me for evaluation of precordial chest discomfort.  Symptoms started about 6 months to 8 months ago, has been gradually noticing more often.  Comes with or without exertion activity however states her physical activity is low due to dyspnea.  She also has chronic dyspnea on exertion that has been ongoing for years and has been gradually getting worse.  Unfortunately still smoking.  Reviewed her external labs, she does have aortic atherosclerosis and hence needs aggressive lipid lowering, she was prescribed Lipitor but patient has not picked up yet.  Encouraged her to start the medication ASAP, also advised her aspirin 81 mg p.o. daily for prophylaxis.  She has had some GERD about 15 years ago but no GI bleed.  Will schedule for an echocardiogram. Schedule for a Lexiscan Nuclear stress test to evaluate for myocardial ischemia. Patient unable to do  treadmill stress testing due to Dyspnea.   Smoking cessation discussed separately, 5 minutes of additional discussions regarding smoking cessation and the effects of smoking on overall cardiovascular disease and COPD.  Patient has Wellbutrin at home she is willing to start, previously had initial success but had failure, hence I prescribed her nicotine patches as well.  Office visit following the work-up/investigations.      Adrian Prows, MD, [facility] 07/23/2021, 2:49 PM Office: [phone]

## 2021-08-06 ENCOUNTER — Ambulatory Visit
Admission: EM | Admit: 2021-08-06 | Discharge: 2021-08-06 | Disposition: A | Discharge status: Home / Self Care | Payer: BC Managed Care – PPO | Attending: Internal Medicine | Admitting: Internal Medicine

## 2021-08-06 ENCOUNTER — Other Ambulatory Visit: Payer: Self-pay

## 2021-08-06 ENCOUNTER — Encounter: Payer: Self-pay | Admitting: Emergency Medicine

## 2021-08-06 DIAGNOSIS — J069 Acute upper respiratory infection, unspecified: Secondary | ICD-10-CM | POA: Diagnosis not present

## 2021-08-06 MED ORDER — BENZONATATE 100 MG PO CAPS: 100.0000 mg | ORAL_CAPSULE | Freq: Three times a day (TID) | ORAL | 0 refills | Status: DC | PRN

## 2021-08-06 MED ORDER — PREDNISONE 20 MG PO TABS: 40.0000 mg | ORAL_TABLET | Freq: Every day | ORAL | 0 refills | Status: AC

## 2021-08-06 NOTE — ED Provider Notes (Signed)
[facility]    CSN: [id number] Arrival date & time: 08/06/21  1016      History   Chief Complaint Chief Complaint  Patient presents with   Cough    HPI Leslie Romero is a 65 y.o. female.   Patient presents with productive cough, sneezing, nasal congestion, sinus pressure that started approximately 1 week ago.  Cough is productive with yellow mucus per patient.  Patient had a T-max of 99 at home.  Her husband has similar symptoms as well.  Denies chest pain, shortness of breath, sore throat, ear pain, nausea, vomiting, diarrhea, abdominal pain.  Patient does have history of COPD and has an albuterol inhaler but reports that she has not had to use her albuterol inhaler more often since being sick.  Patient has taken over-the-counter cough and cold medications including Mucinex with minimal improvement in symptoms.   Cough  Past Medical History:  Diagnosis Date   Allergy    Depression    Hypertension     Patient Active Problem List   Diagnosis Date Noted   Myalgia 10/26/2016   Flu-like symptoms 10/26/2016   Frequent urination 10/26/2016   Patellofemoral arthritis of right knee 09/29/2016   COPD GOLD III 09/21/2016   Multiple pulmonary nodules determined by computed tomography of lung 09/21/2016   Medial epicondylitis of elbow, right 08/27/2016   Elevated blood pressure reading 08/26/2016   Vitamin D insufficiency 07/20/2016   Encounter for screening for lung cancer 07/20/2016   SOB (shortness of breath) on exertion 07/20/2016   Class 1 drug-induced obesity in adult 06/20/2016   Family history of Downs syndrome- son; pt is caretaker 06/20/2016   son w/ bipolar disorder 06/20/2016   GAD (generalized anxiety disorder) 06/20/2016   Adjustment disorder with mixed anxiety and depressed mood 06/20/2016   Elevated blood pressure reading in office without diagnosis of hypertension 06/20/2016   Recurrent knee pain- R  06/16/2016   History of gastritis 06/16/2016    Cigarette smoker 06/16/2016   Tobacco abuse counseling 06/16/2016   Fatigue 07/29/2008   DIARRHEA-PRESUMED INFECTIOUS 07/10/2008   Duodenitis determined by biopsy '09 06/07/2008   GERD 06/03/2008   DYSPHAGIA 06/03/2008   ABDOMINAL PAIN-EPIGASTRIC 06/03/2008    Past Surgical History:  Procedure Laterality Date   ECTOPIC PREGNANCY SURGERY     x 2   EYE SURGERY     WISDOM TOOTH EXTRACTION      OB History   No obstetric history on file.      Home Medications    Prior to Admission medications   Medication Sig Start Date End Date Taking? Authorizing Provider  benzonatate (TESSALON) 100 MG capsule Take 1 capsule (100 mg total) by mouth every 8 (eight) hours as needed for cough. 08/06/21  Yes Clary Meeker, Rolly Salter E, FNP  predniSONE (DELTASONE) 20 MG tablet Take 2 tablets (40 mg total) by mouth daily for 5 days. 08/06/21 08/11/21 Yes Orie Cuttino, Acie Fredrickson, FNP  acetaminophen (TYLENOL) 325 MG tablet Take 650 mg by mouth every 6 (six) hours as needed.    [provider]  albuterol (PROVENTIL HFA;VENTOLIN HFA) 108 (90 Base) MCG/ACT inhaler Inhale 2 puffs into the lungs every 4 (four) hours as needed for wheezing (cough, shortness of breath or wheezing.). 11/26/16   Nyoka Cowden, MD  aspirin EC 81 MG tablet Take 1 tablet (81 mg total) by mouth daily. 07/23/21   Yates Decamp, MD  BLACK ELDERBERRY,BERRY-FLOWER, PO Take by mouth.    [provider]  Virgel Bouquet  ELLIPTA 200-25 MCG/INH AEPB INHALE 1 PUFF BY MOUTH EVERY DAY 09/08/20   Nyoka Cowden, MD  Cholecalciferol (VITAMIN D-3) 5000 units TABS Take 1 tablet by mouth daily.    [provider]  levocetirizine (XYZAL) 5 MG tablet Take 1 tablet (5 mg total) by mouth every evening. 05/03/21   Wallis Bamberg, PA-C  magnesium 30 MG tablet Take 30 mg by mouth 2 (two) times daily. Patient not taking: Reported on 07/23/2021    [provider]  nicotine (NICODERM CQ - DOSED IN MG/24 HOURS) 14 mg/24hr patch Place 1 patch (14 mg total) onto  the skin daily. 07/23/21   Yates Decamp, MD  nicotine (NICODERM CQ) 7 mg/24hr patch Place 1 patch (7 mg total) onto the skin daily. Start after 14 mg Rx is used up 08/24/21   Yates Decamp, MD  Zinc 100 MG TABS Take by mouth.    [provider]    Family History Family History  Problem Relation Age of Onset   Thyroid disease Mother    COPD Mother    Cancer Father        lung   Diabetes Father    Hyperlipidemia Father    Hypertension Father    Stroke Father    Thyroid disease Sister    Diabetes Sister    Heart attack Brother    Drug abuse Brother        Pain killer addiction   Other Son        Set designer accident   Breast cancer Neg Hx     Social History Social History   Tobacco Use   Smoking status: Every Day    Packs/day: 1.00    Years: 40.00    Pack years: 40.00    Types: Cigarettes   Smokeless tobacco: Never  Vaping Use   Vaping Use: Never used  Substance Use Topics   Alcohol use: No   Drug use: No     Allergies   Moxifloxacin   Review of Systems Review of Systems Per HPI  Physical Exam Triage Vital Signs ED Triage Vitals  Enc Vitals Group     BP 08/06/21 1125 138/78     Pulse Rate 08/06/21 1125 79     Resp 08/06/21 1125 16     Temp 08/06/21 1125 98.2 F (36.8 C)     Temp Source 08/06/21 1125 Oral     SpO2 08/06/21 1125 95 %     Weight --      Height --      Head Circumference --      Peak Flow --      Pain Score 08/06/21 1124 7     Pain Loc --      Pain Edu? --      Excl. in GC? --    No data found.  Updated Vital Signs BP 138/78 (BP Location: Left Arm)    Pulse 79    Temp 98.2 F (36.8 C) (Oral)    Resp 16    SpO2 95%   Visual Acuity Right Eye Distance:   Left Eye Distance:   Bilateral Distance:    Right Eye Near:   Left Eye Near:    Bilateral Near:     Physical Exam Constitutional:      General: She is not in acute distress.    Appearance: Normal appearance. She is not toxic-appearing or diaphoretic.  HENT:     Head:  Normocephalic and atraumatic.     Right  Ear: Tympanic membrane and ear canal normal.     Left Ear: Tympanic membrane and ear canal normal.     Nose: Congestion present.     Mouth/Throat:     Mouth: Mucous membranes are moist.     Pharynx: No posterior oropharyngeal erythema.  Eyes:     Extraocular Movements: Extraocular movements intact.     Conjunctiva/sclera: Conjunctivae normal.     Pupils: Pupils are equal, round, and reactive to light.  Cardiovascular:     Rate and Rhythm: Normal rate and regular rhythm.     Pulses: Normal pulses.     Heart sounds: Normal heart sounds.  Pulmonary:     Effort: Pulmonary effort is normal. No respiratory distress.     Breath sounds: Normal breath sounds. No stridor. No wheezing, rhonchi or rales.  Abdominal:     General: Abdomen is flat. Bowel sounds are normal.     Palpations: Abdomen is soft.  Musculoskeletal:        General: Normal range of motion.     Cervical back: Normal range of motion.  Skin:    General: Skin is warm and dry.  Neurological:     General: No focal deficit present.     Mental Status: She is alert and oriented to person, place, and time. Mental status is at baseline.  Psychiatric:        Mood and Affect: Mood normal.        Behavior: Behavior normal.     UC Treatments / Results  Labs (all labs ordered are listed, but only abnormal results are displayed) Labs Reviewed  COVID-19, FLU A+B NAA    EKG   Radiology No results found.  Procedures Procedures (including critical care time)  Medications Ordered in UC Medications - No data to display  Initial Impression / Assessment and Plan / UC Course  I have reviewed the triage vital signs and the nursing notes.  Pertinent labs & imaging results that were available during my care of the patient were reviewed by me and considered in my medical decision making (see chart for details).     Patient presents with symptoms likely from a viral upper respiratory  infection. Differential includes bacterial pneumonia, sinusitis, allergic rhinitis, COVID 19, flu. Do not suspect underlying cardiopulmonary process. Symptoms seem unlikely related to ACS, CHF or COPD exacerbations, pneumonia, pneumothorax. Patient is nontoxic appearing and not in need of emergent medical intervention.  COVID-19 and flu test pending.  Recommended symptom control with over the counter medications.  Prednisone x5 days to alleviate symptoms.  Benzonatate prescribed to take as needed for cough.  Suggested chest x-ray to patient due to productive cough and history of COPD.  Patient declined x-ray.  Risks associated with not doing x-ray discussed with patient.  Patient voiced understanding.  Return if symptoms fail to improve in 1-2 weeks or you develop shortness of breath, chest pain, severe headache. Patient states understanding and is agreeable.  Discharged with PCP followup.  Final Clinical Impressions(s) / UC Diagnoses   Final diagnoses:  Viral upper respiratory tract infection with cough     Discharge Instructions      It appears you have a viral upper respiratory infection that should resolve in the next few days with symptomatic treatment.  You have been prescribed 2 medications to help alleviate your symptoms.  COVID-19 and flu test is pending.  We will call if it is positive.    ED Prescriptions     Medication Sig Dispense  Auth. Provider   predniSONE (DELTASONE) 20 MG tablet Take 2 tablets (40 mg total) by mouth daily for 5 days. 10 tablet [place], Gilman E, [place]   benzonatate (TESSALON) 100 MG capsule Take 1 capsule (100 mg total) by mouth every 8 (eight) hours as needed for cough. 21 capsule [place], Acie Fredrickson, [place]      PDMP not reviewed this encounter.   Gustavus Bryant, [place] 08/06/21 1150

## 2021-08-06 NOTE — ED Triage Notes (Signed)
Symptoms x 1 week. Cough, sneezing. Husband sick first. Negative at home test. Tried mucinex without improvement. When son had covid he got admitted to ICU, and she's concerned she may give him something. Had a fever last night, treated with advil. Also c/o sinus/facial pain. C/o thick, yellow mucus.

## 2021-08-06 NOTE — Discharge Instructions (Signed)
It appears you have a viral upper respiratory infection that should resolve in the next few days with symptomatic treatment.  You have been prescribed 2 medications to help alleviate your symptoms.  COVID-19 and flu test is pending.  We will call if it is positive.

## 2021-08-07 LAB — COVID-19, FLU A+B NAA
Influenza A, NAA: NOT DETECTED
Influenza B, NAA: NOT DETECTED
SARS-CoV-2, NAA: NOT DETECTED

## 2021-08-26 ENCOUNTER — Other Ambulatory Visit: Payer: BC Managed Care – PPO

## 2021-08-31 NOTE — Progress Notes (Signed)
NEW PATIENT Date of Service/Encounter:  09/02/21 Referring provider: Sueanne Margarita, DO Primary care provider: Sueanne Margarita, DO  Subjective:  Leslie Romero is a 66 y.o. female with a PMHx of COPD GOLD III, aorthic atherosclerosis, anxiety, GERD presenting today for evaluation of "allergies" History obtained from: chart review and patient.   Chronic rhinitis: started when she was young child Symptoms include:  sinus pressure and congestion and nasal congestion  Occurs seasonally-Spring and Fall Treatments tried: Xyzal (levocetirizine), she doesn't like flonase because it smells bad and causes her to have headache Strong smells really bother her-give her a headache Previous allergy testing: no History of reflux/heartburn:  prescribed omeprazole 40 mg daily Additionally, she has recurrent sinus infections (~2/year).  Usually in Spring and Fall.  Usually ends up on antibiotics.  Only one lifetime pneumonia. No hospitalizations. She does have a new cat a few months ago.  Has some increased symptoms since getting the cat.  COPD GOLD III:  Breo 100 and albuterol infrequently 02/05/20 alpha-1 antitrypsin wnl, IgE 29, AEC 200 Follows with Dr. Melvyn Novas (Pulmonary) Never diagnosed with asthma but told she may have had it because would have coughing fits in her childhood. Smokes 1-0.5 ppd 45-50 yrs; she does have wellbutrin and patches with quit date planned next day  Other allergy screening: Asthma: no Food allergy: no Medication allergy:  Avalox-caused hives Hymenoptera allergy: no; wasp did cause her to have localized reaction on her knee, yellow jacket bites also caused her to have localized swelling Eczema:no History of recurrent infections suggestive of immunodeficency: no Vaccinations are up to date.   Past Medical History: Past Medical History:  Diagnosis Date   Allergy    Depression    Hypertension    Medication List:  Current Outpatient Medications  Medication Sig  Dispense Refill   acetaminophen (TYLENOL) 325 MG tablet Take 650 mg by mouth every 6 (six) hours as needed.     albuterol (PROVENTIL HFA;VENTOLIN HFA) 108 (90 Base) MCG/ACT inhaler Inhale 2 puffs into the lungs every 4 (four) hours as needed for wheezing (cough, shortness of breath or wheezing.). 1 Inhaler 4   aspirin EC 81 MG tablet Take 1 tablet (81 mg total) by mouth daily. 90 tablet 3   azelastine (ASTELIN) 0.1 % nasal spray Place 2 sprays into both nostrils 2 (two) times daily as needed for rhinitis. Use in each nostril as directed 30 mL 12   BLACK ELDERBERRY,BERRY-FLOWER, PO Take by mouth.     BREO ELLIPTA 200-25 MCG/INH AEPB INHALE 1 PUFF BY MOUTH EVERY DAY 60 each 0   buPROPion (WELLBUTRIN SR) 150 MG 12 hr tablet Take 150 mg by mouth 2 (two) times daily.     Cholecalciferol (VITAMIN D-3) 5000 units TABS Take 1 tablet by mouth daily.     levocetirizine (XYZAL) 5 MG tablet Take 1 tablet (5 mg total) by mouth every evening. 90 tablet 0   nicotine (NICODERM CQ - DOSED IN MG/24 HOURS) 14 mg/24hr patch Place 1 patch (14 mg total) onto the skin daily. 28 patch 0   nicotine (NICODERM CQ) 7 mg/24hr patch Place 1 patch (7 mg total) onto the skin daily. Start after 14 mg Rx is used up 28 patch 0   omeprazole (PRILOSEC) 40 MG capsule PLEASE SEE ATTACHED FOR DETAILED DIRECTIONS     Zinc 100 MG TABS Take by mouth.     benzonatate (TESSALON) 100 MG capsule Take 1 capsule (100 mg total) by mouth every 8 (eight) hours as  needed for cough. (Patient not taking: Reported on 09/02/2021) 21 capsule 0   Current Facility-Administered Medications  Medication Dose Route Frequency Provider Last Rate Last Admin   ipratropium (ATROVENT) nebulizer solution 0.5 mg  0.5 mg Nebulization Once Le, Thao P, DO       Known Allergies:  Allergies  Allergen Reactions   Moxifloxacin Rash   Past Surgical History: Past Surgical History:  Procedure Laterality Date   ECTOPIC PREGNANCY SURGERY     x 2   EYE SURGERY      WISDOM TOOTH EXTRACTION     Family History: Family History  Problem Relation Age of Onset   Allergic rhinitis Mother    Thyroid disease Mother    Sinusitis Mother    COPD Father    Cancer Father        lung   Diabetes Father    Hyperlipidemia Father    Hypertension Father    Stroke Father    Allergic rhinitis Sister    Thyroid disease Sister    Diabetes Sister    Allergic rhinitis Sister    Heart attack Brother    Drug abuse Brother        Pain killer addiction   Sinusitis Maternal Aunt    Allergic rhinitis Son    Other Son        Musician accident   Food Allergy Nephew    Breast cancer Neg Hx    Immunodeficiency Neg Hx    Eczema Neg Hx    Asthma Neg Hx    Atopy Neg Hx    Social History: [place] lives in a home built 48 years ago, + water damage,wood floors, gas heating, central AC, 1 dog, 1 cat (newly acquired a few months ago), no cockroaches, + DM protection on bedding, no smoke exposure, primary care giver for special needs son who has had bad health recently, no exposure to fumes, chemicals or dust with job or hobbies, + HEPA filter in [place] home[place]   ROS:  All other systems negative except as noted per HPI.  Objective:  Blood pressure (!) 152/76. There is no height or weight on file to calculate BMI. Physical Exam:  General Appearance:  Alert, cooperative, no distress, appears stated age  Head:  Normocephalic, without obvious abnormality, atraumatic  Eyes:  Conjunctiva clear, EOM's intact  Nose: Nares normal, hypertrophic turbinates, normal mucosa, and no visible anterior polyps  Throat: Lips, tongue normal; teeth and gums normal, normal posterior oropharynx  Neck: Supple, symmetrical  Lungs:   clear to auscultation bilaterally, Respirations unlabored, no coughing  Heart:  regular rate and rhythm and no murmur, Appears well perfused  Extremities: No edema  Skin: Skin color, texture, turgor normal, no rashes or lesions on visualized  portions of skin  Neurologic: No gross deficits     Diagnostics: Skin Testing: Environmental allergy panel.  Adequate controls. Results discussed with patient/family.  Airborne Adult Perc - 09/02/21 1520     Time Antigen Placed 1520    Allergen Manufacturer Lavella Hammock    Location Back    Number of Test 59    1. Control-Buffer 50% Glycerol Negative    2. Control-Histamine 1 mg/ml 3+    3. Albumin saline Negative    4. Ch[place] Negative    5. [place] Negative    6. Johnson Negative    7. Golf Blue Negative    8. Meadow Fescue Negative    9. Perennial Rye Negative    10.  Sweet Vernal Negative    11. Timothy Negative    12. Cocklebur Negative    13. Burweed Marshelder Negative    14. Ragweed, short Negative    15. Ragweed, Giant Negative    16. Plantain,  English Negative    17. Lamb's Quarters Negative    18. Sheep Sorrell Negative    19. Rough Pigweed Negative    20. Marsh Elder, Rough Negative    21. Mugwort, Common Negative    22. Ash mix Negative    23. Birch mix Negative    24. Beech American Negative    25. Box, Elder Negative    26. Cedar, red Negative    27. Cottonwood, [place] Negative    28. Elm mix Negative    29. Hickory Negative    30. Maple mix Negative    31. Oak, [place] mix Negative    32. Pecan Pollen Negative    33. Pine mix Negative    34. Sycamore Eastern Negative    35. Wasta, Black Pollen Negative    36. Alternaria alternata Negative    37. Cladosporium Herbarum Negative    38. Aspergillus mix Negative    39. Penicillium mix Negative    40. Bipolaris sorokiniana (Helminthosporium) Negative    41. Drechslera spicifera (Curvularia) Negative    42. Mucor plumbeus Negative    43. Fusarium moniliforme Negative    44. Aureobasidium pullulans (pullulara) Negative    45. Rhizopus oryzae Negative    46. Botrytis cinera Negative    47. Epicoccum nigrum Negative    48. Phoma betae Negative    49. Candida Albicans Negative    50. Trichophyton  mentagrophytes Negative    51. Mite, D Farinae  5,000 AU/ml Negative    52. Mite, D Pteronyssinus  5,000 AU/ml Negative    53. Cat Hair 10,000 BAU/ml Negative    54.  Dog Epithelia Negative    55. Mixed Feathers Negative    56. Horse Epithelia Negative    57. Cockroach, German Negative    58. Mouse Negative    59. Tobacco Leaf Negative             Intradermal - 09/02/21 1553     Time Antigen Placed 1553    Allergen Manufacturer Lavella Hammock    Location Back    Number of Test 15    Control Negative    [place] Negative    Johnson Negative    7 Grass Negative    Ragweed mix Negative    Weed mix Negative    Tree mix Negative    Mold 1 1+    Mold 2 3+    Mold 3 3+    Mold 4 Negative    Cat Negative    Dog Negative    Cockroach Negative    Mite mix Negative    Other Omitted             Allergy testing results were read and interpreted by myself, documented by clinical staff.  Assessment and Plan   Rhinitis-suspect mixed with mostly nonallergic component.  Irritant by history, suspect vasomotor component as well.  Discussed affects on cilia from smoking (she is planning to quit next month).  IDs were positive to indoor molds and she does have some water damage at home that she is planning to take care of.  Additionally she has reflux which is also likely contributing.   Patient Instructions  Chronic Rhinitis - Mixed (irritant, and allergic): - allergy  testing today was negative to skin testing and positive to mold only on intradermals - irritant avoidance (perfumes, strong smells and chemicals) and mold avoidance (see below) - nasal saline spray can use as needed, use first, then use medicated nasal sprays - Start Nasal Steroid Spray: Options include Flonase Sensimist (fluticasone), Nasocort (triamcinolone), Nasonex (mometasome) 1- 2 sprays in each nostril daily (can buy over-the-counter if not covered by insurance)  Best results if used daily. Avoid nose sprays with  alcohol base or scented (like regular flonase) - Start Astelin (Azelastine) 1-2 sprays in each nostril twice a day as needed.  You may use this as needed for nasal congestion/itchy ears/itchy nose if desired - Continue over the counter antihistamine daily or daily as needed.   -Your options include Zyrtec (Cetirizine) 10mg , Claritin (Loratadine) 10mg , Allegra (Fexofenadine) 180mg , or Xyzal (Levocetirinze) 5mg  - Stop Smoking (YOU CAN DO IT!!!) - we can consider allergy shots if all else fails, but I suspect most of your symptoms are nonallergic in nature  History of rash to moxifloxacin:  - discussed strict avoidance of this medication and all similar class medications  COPD:  - continue all medications as prescribed by Dr. Melvyn Novas  Follow-up in 3 months, sooner if needed.   This note in its entirety was forwarded to the Provider who requested this consultation.  Thank you for your kind referral. I appreciate the opportunity to take part in Holle's care. Please do not hesitate to contact me with questions.  Sincerely,  Sigurd Sos, MD [facility]

## 2021-09-02 ENCOUNTER — Other Ambulatory Visit: Payer: Self-pay

## 2021-09-02 ENCOUNTER — Encounter: Payer: Self-pay | Admitting: Internal Medicine

## 2021-09-02 ENCOUNTER — Ambulatory Visit (INDEPENDENT_AMBULATORY_CARE_PROVIDER_SITE_OTHER): Payer: BC Managed Care – PPO | Admitting: Internal Medicine

## 2021-09-02 VITALS — BP 152/76

## 2021-09-02 DIAGNOSIS — J310 Chronic rhinitis: Secondary | ICD-10-CM | POA: Diagnosis not present

## 2021-09-02 DIAGNOSIS — Z889 Allergy status to unspecified drugs, medicaments and biological substances status: Secondary | ICD-10-CM

## 2021-09-02 MED ORDER — AZELASTINE HCL 0.1 % NA SOLN: 2.0000 | Freq: Two times a day (BID) | NASAL | 12 refills | Status: DC | PRN

## 2021-09-02 NOTE — Patient Instructions (Addendum)
Chronic Rhinitis - Mixed (irritant, and allergic): - allergy testing today was negative to skin testing and positive to mold only on intradermals - irritant avoidance (perfumes, strong smells and chemicals) and mold avoidance (see below) - nasal saline spray can use as needed, use first, then use medicated nasal sprays - Start Nasal Steroid Spray: Options include Flonase Sensimist (fluticasone), Nasocort (triamcinolone), Nasonex (mometasome) 1- 2 sprays in each nostril daily (can buy over-the-counter if not covered by insurance)  Best results if used daily. Avoid nose sprays with alcohol base or scented (like regular flonase) - Start Astelin (Azelastine) 1-2 sprays in each nostril twice a day as needed.  You may use this as needed for nasal congestion/itchy ears/itchy nose if desired - Continue over the counter antihistamine daily or daily as needed.   -Your options include Zyrtec (Cetirizine) 10mg , Claritin (Loratadine) 10mg , Allegra (Fexofenadine) 180mg , or Xyzal (Levocetirinze) 5mg  - Stop Smoking (YOU CAN DO IT!!!) - continue reflux medications as this is also likely contributing. - we can consider allergy shots if all else fails, but I suspect most of your symptoms are nonallergic in nature   COPD:  - continue all medications as prescribed by Dr.  Follow-up in 3 months, sooner if needed.   Control of Mold Allergen   Mold and fungi can grow on a variety of surfaces provided certain temperature and moisture conditions exist.  Outdoor molds grow on plants, decaying vegetation and soil.  The major outdoor mold, Alternaria and Cladosporium, are found in very high numbers during hot and dry conditions.  Generally, a late Summer - Fall peak is seen for common outdoor fungal spores.  Rain will temporarily lower outdoor mold spore count, but counts rise rapidly when the rainy period ends.  The most important indoor molds are Aspergillus and Penicillium.  Dark, humid and poorly ventilated  basements are ideal sites for mold growth.  The next most common sites of mold growth are the bathroom and the kitchen.  Indoor (Perennial) Mold Control   Positive indoor molds via skin testing: Aspergillus, Penicillium, Fusarium, Aureobasidium (Pullulara), and Rhizopus  Maintain humidity below 50%. Clean washable surfaces with 5% bleach solution. Remove sources e.g. contaminated carpets.

## 2021-09-03 ENCOUNTER — Ambulatory Visit: Payer: BC Managed Care – PPO | Admitting: Student

## 2021-09-04 ENCOUNTER — Other Ambulatory Visit: Payer: Self-pay | Admitting: Cardiology

## 2021-09-04 DIAGNOSIS — F17210 Nicotine dependence, cigarettes, uncomplicated: Secondary | ICD-10-CM

## 2021-09-04 DIAGNOSIS — J432 Centrilobular emphysema: Secondary | ICD-10-CM

## 2021-09-22 DIAGNOSIS — J449 Chronic obstructive pulmonary disease, unspecified: Secondary | ICD-10-CM | POA: Diagnosis not present

## 2021-09-22 DIAGNOSIS — Z1339 Encounter for screening examination for other mental health and behavioral disorders: Secondary | ICD-10-CM | POA: Diagnosis not present

## 2021-09-22 DIAGNOSIS — E785 Hyperlipidemia, unspecified: Secondary | ICD-10-CM | POA: Diagnosis not present

## 2021-09-22 DIAGNOSIS — Z1331 Encounter for screening for depression: Secondary | ICD-10-CM | POA: Diagnosis not present

## 2021-09-23 ENCOUNTER — Ambulatory Visit: Payer: BC Managed Care – PPO

## 2021-09-23 ENCOUNTER — Other Ambulatory Visit: Payer: Self-pay

## 2021-09-23 DIAGNOSIS — I700 Atherosclerosis of aorta: Secondary | ICD-10-CM | POA: Diagnosis not present

## 2021-09-23 DIAGNOSIS — R072 Precordial pain: Secondary | ICD-10-CM

## 2021-09-23 DIAGNOSIS — R0609 Other forms of dyspnea: Secondary | ICD-10-CM

## 2021-10-06 ENCOUNTER — Ambulatory Visit: Payer: BC Managed Care – PPO | Admitting: Student

## 2021-11-04 ENCOUNTER — Ambulatory Visit: Payer: BC Managed Care – PPO | Admitting: Student

## 2021-11-12 ENCOUNTER — Ambulatory Visit: Payer: BC Managed Care – PPO | Admitting: Student

## 2021-11-26 ENCOUNTER — Ambulatory Visit: Payer: BC Managed Care – PPO | Admitting: Student

## 2021-11-30 NOTE — Progress Notes (Signed)
? ?RE: Leslie Romero MRN: [id number] DOB: [birth year] ?Date of Telemedicine Visit: 12/02/2021 ? ?Referring provider: Charlane Ferretti, DO ?Primary care provider: Charlane Ferretti, DO ? ?Chief Complaint: Chronic Rhinitis (3 mth f/u - Xyzal  seems to be helping.) and Allergic Reaction (Drug Allergy - 3 mth f/u - Patient states she has avoided all allergens) ? ? ?Telemedicine Follow Up Visit via Telephone: ?I connected with Leslie Romero for a follow up on 12/02/21 by telephone and verified that I am speaking with the correct person using two identifiers. ?  ?I discussed the limitations, risks, security and privacy concerns of performing an evaluation and management service by telephone and the availability of in person appointments. I also discussed with the patient that there may be a patient responsible charge related to this service. The patient expressed understanding and agreed to proceed. ? ?Patient is at home.  ?Provider is at the office.  ?Visit start time: 11:30 AM ?Visit end time: 11:40 AM ?Insurance consent/check in by: Maeola Sarah ?Medical consent and medical assistant/nurse: Areta Haber, CMA ? ?History of Present Illness: ?Leslie Romero (DOB: 1955/12/17) is a 66 y.o. female PMHx of COPD GOLD III, aorthic atherosclerosis, anxiety, GERDwho [facility] in re-evaluation of the following: mixed rhinitis,  ?History obtained from: chart review and patient. ? ?For Review, LV was on 09/02/21  with Dr.Janmichael Giraud seen for initial visit.  Reported symptoms consistent with irritant rhinitis with allergy testing + on IDs to mold only. Did report history of water damage in home.  Significant smoking history.  Discussed avoidane of irritants and molds. Starting non-irritating INCS and azelastine.  ? ?Pertinent History/Diagnostics:  ?-COPD GOLD III: managed by Dr. [facility] ? - Breo 100 and albuterol infrequently ?-  02/05/20 alpha-1 antitrypsin wnl, IgE 29, AEC 200 ?-  Never diagnosed with asthma but told she may have had it because would have coughing fits in her childhood. ?-Smokes 1-0.5 ppd 45-50 yrs; she does have wellbutrin and patches with quit date planned  ?- Mixed Rhinitis (vasomotor/irritant/+/-allergic):  ? - IDs environmental panel (09/02/21): + mold mix 1, 2, 3 ? ?Today presents for follow-up. ?She does feel like her nasal symptoms are overall well-controlled when she is take Xyzal.  She did try the azelastine once, but couldn't tolerate the taste.  She has not tried a non-irritating INCS like Nasacort.  She wasn't sure if she could take this with her antihistamine.   ? ?Assessment and Plan: ?Leslie Romero is a 66 y.o. female with: ? ?Chronic Rhinitis - Mixed (irritant, and allergic): improved ?- previous allergen avoidance was negative to skin testing and positive to mold only on intradermals ?- irritant avoidance (perfumes, strong smells and chemicals) and mold avoidance (see below) ?- nasal saline spray can use as needed, use first, then use medicated nasal sprays ?- Start Nasal Steroid Spray: Options include Flonase Sensimist (fluticasone), Nasocort (triamcinolone), Nasonex (mometasome) 1- 2 sprays in each nostril daily (can buy over-the-counter if not covered by insurance)  Best results if used daily. ?Avoid nose sprays with alcohol base or scented (like regular flonase) ?- Continue Astelin (Azelastine) 1-2 sprays in each nostril twice a day as needed.  You may use this as needed for nasal congestion/itchy ears/itchy nose if desired ?- Continue over the counter antihistamine daily or daily as needed.   ?-Your options include Zyrtec (Cetirizine) 10mg , Claritin (Loratadine) 10mg , Allegra (Fexofenadine) 180mg , or Xyzal (Levocetirinze) 5mg  ?- Stop Smoking (YOU CAN DO IT!!!) ?- continue reflux  medications as this is also likely contributing. ?- we can consider allergy shots if all else fails, but I suspect most of your symptoms are nonallergic in nature ? ? ?COPD:  ?-  continue all medications as prescribed by Dr. Sherene Sires[facility] ? ?Follow-up in 12 months, sooner if needed.  ? ? ?Meds ordered this encounter  ?Medications  ? levocetirizine (XYZAL) 5 MG tablet  ?  Sig: Take 1 tablet (5 mg total) by mouth every evening.  ?  Dispense:  90 tablet  ?  Refill:  1  ? triamcinolone (NASACORT) 55 MCG/ACT AERO nasal inhaler  ?  Sig: Place 2 sprays into the nose daily.  ?  Dispense:  1 each  ?  Refill:  12  ? ?Lab Orders  ?No laboratory test(s) ordered today  ? ? ?Diagnostics: ?None. ? ?Medication List:  ?Current Outpatient Medications  ?Medication Sig Dispense Refill  ? acetaminophen (TYLENOL) 325 MG tablet Take 650 mg by mouth every 6 (six) hours as needed.    ? albuterol (PROVENTIL HFA;VENTOLIN HFA) 108 (90 Base) MCG/ACT inhaler Inhale 2 puffs into the lungs every 4 (four) hours as needed for wheezing (cough, shortness of breath or wheezing.). 1 Inhaler 4  ? aspirin EC 81 MG tablet Take 1 tablet (81 mg total) by mouth daily. 90 tablet 3  ? atorvastatin (LIPITOR) 20 MG tablet Take 20 mg by mouth daily.    ? azelastine (ASTELIN) 0.1 % nasal spray Place 2 sprays into both nostrils 2 (two) times daily as needed for rhinitis. Use in each nostril as directed 30 mL 12  ? BLACK ELDERBERRY,BERRY-FLOWER, PO Take by mouth.    ? BREO ELLIPTA 200-25 MCG/INH AEPB INHALE 1 PUFF BY MOUTH EVERY DAY 60 each 0  ? buPROPion (WELLBUTRIN SR) 150 MG 12 hr tablet Take 150 mg by mouth 2 (two) times daily.    ? Cholecalciferol (VITAMIN D-3) 5000 units TABS Take 1 tablet by mouth daily.    ? nicotine (NICODERM CQ) 7 mg/24hr patch Place 1 patch (7 mg total) onto the skin daily. Start after 14 mg Rx is used up 28 patch 0  ? omeprazole (PRILOSEC) 40 MG capsule PLEASE SEE ATTACHED FOR DETAILED DIRECTIONS    ? triamcinolone (NASACORT) 55 MCG/ACT AERO nasal inhaler Place 2 sprays into the nose daily. 1 each 12  ? Zinc 100 MG TABS Take by mouth.    ? levocetirizine (XYZAL) 5 MG tablet Take 1 tablet (5 mg total) by mouth every  evening. 90 tablet 1  ? ?Current Facility-Administered Medications  ?Medication Dose Route Frequency Provider Last Rate Last Admin  ? ipratropium (ATROVENT) nebulizer solution 0.5 mg  0.5 mg Nebulization Once Le, Thao P, DO      ? ?Allergies: ?Allergies  ?Allergen Reactions  ? Moxifloxacin Rash  ? ?I reviewed her past medical history, social history, family history, and environmental history and no significant changes have been reported from previous visit. ? ?Review of Systems-negative except as per HPI ?Objective: ?Physical Exam ?Not obtained as encounter was done via telephone.  ? ?Previous notes and tests were reviewed. ? ?I discussed the assessment and treatment plan with the patient. The patient was provided an opportunity to ask questions and all were answered. The patient agreed with the plan and demonstrated an understanding of the instructions. ?  ?The patient was advised to call back or seek an in-person evaluation if the symptoms worsen or if the condition fails to improve as anticipated. ? ?I provided 10 minutes of  non-face-to-face time during this encounter. ? ?It was my pleasure to participate in Leslie Romero's care today. Please feel free to contact me with any questions or concerns.  ? ?Sincerely, ? ?Tonny Bollman, MD ? ? ?

## 2021-12-02 ENCOUNTER — Encounter: Payer: Self-pay | Admitting: Internal Medicine

## 2021-12-02 ENCOUNTER — Ambulatory Visit (INDEPENDENT_AMBULATORY_CARE_PROVIDER_SITE_OTHER): Payer: BC Managed Care – PPO | Admitting: Internal Medicine

## 2021-12-02 DIAGNOSIS — J300 Vasomotor rhinitis: Secondary | ICD-10-CM

## 2021-12-02 DIAGNOSIS — Z889 Allergy status to unspecified drugs, medicaments and biological substances status: Secondary | ICD-10-CM

## 2021-12-02 DIAGNOSIS — J310 Chronic rhinitis: Secondary | ICD-10-CM

## 2021-12-02 MED ORDER — LEVOCETIRIZINE DIHYDROCHLORIDE 5 MG PO TABS: 5.0000 mg | ORAL_TABLET | Freq: Every evening | ORAL | 1 refills | Status: DC

## 2021-12-02 MED ORDER — TRIAMCINOLONE ACETONIDE 55 MCG/ACT NA AERO: 2.0000 | INHALATION_SPRAY | Freq: Every day | NASAL | 12 refills | Status: AC

## 2021-12-02 NOTE — Patient Instructions (Signed)
Chronic Rhinitis - Mixed (irritant, and allergic): improved ?- previous allergen avoidance was negative to skin testing and positive to mold only on intradermals ?- irritant avoidance (perfumes, strong smells and chemicals) and mold avoidance (see below) ?- nasal saline spray can use as needed, use first, then use medicated nasal sprays ?- Start Nasal Steroid Spray: Options include Flonase Sensimist (fluticasone), Nasocort (triamcinolone), Nasonex (mometasome) 1- 2 sprays in each nostril daily (can buy over-the-counter if not covered by insurance)  Best results if used daily. ?Avoid nose sprays with alcohol base or scented (like regular flonase) ?- Continue Astelin (Azelastine) 1-2 sprays in each nostril twice a day as needed.  You may use this as needed for nasal congestion/itchy ears/itchy nose if desired ?- Continue over the counter antihistamine daily or daily as needed.   ?-Your options include Zyrtec (Cetirizine) 10mg , Claritin (Loratadine) 10mg , Allegra (Fexofenadine) 180mg , or Xyzal (Levocetirinze) 5mg  ?- Stop Smoking (YOU CAN DO IT!!!) ?- continue reflux medications as this is also likely contributing. ?- we can consider allergy shots if all else fails, but I suspect most of your symptoms are nonallergic in nature ? ? ?COPD:  ?- continue all medications as prescribed by Dr. ? ?Follow-up in 12 months, sooner if needed.  ? ?Control of Mold Allergen  ? ?Mold and fungi can grow on a variety of surfaces provided certain temperature and moisture conditions exist.  Outdoor molds grow on plants, decaying vegetation and soil.  The major outdoor mold, Alternaria and Cladosporium, are found in very high numbers during hot and dry conditions.  Generally, a late Summer - Fall peak is seen for common outdoor fungal spores.  Rain will temporarily lower outdoor mold spore count, but counts rise rapidly when the rainy period ends.  The most important indoor molds are Aspergillus and Penicillium.  Dark, humid and poorly  ventilated basements are ideal sites for mold growth.  The next most common sites of mold growth are the bathroom and the kitchen. ? ?Indoor (Perennial) Mold Control  ? ?Positive indoor molds via skin testing: Aspergillus, Penicillium, Fusarium, Aureobasidium ([place]), and Rhizopus ? ?Maintain humidity below 50%. ?Clean washable surfaces with 5% bleach solution. ?Remove sources e.g. contaminated carpets. ? ? ? ?

## 2021-12-22 DIAGNOSIS — I700 Atherosclerosis of aorta: Secondary | ICD-10-CM | POA: Diagnosis not present

## 2021-12-22 DIAGNOSIS — Z1339 Encounter for screening examination for other mental health and behavioral disorders: Secondary | ICD-10-CM | POA: Diagnosis not present

## 2021-12-22 DIAGNOSIS — Z780 Asymptomatic menopausal state: Secondary | ICD-10-CM | POA: Diagnosis not present

## 2021-12-22 DIAGNOSIS — Z1331 Encounter for screening for depression: Secondary | ICD-10-CM | POA: Diagnosis not present

## 2022-04-22 ENCOUNTER — Ambulatory Visit
Admission: RE | Admit: 2022-04-22 | Discharge: 2022-04-22 | Disposition: A | Discharge status: Home / Self Care | Payer: BLUE CROSS/BLUE SHIELD | Source: Ambulatory Visit | Attending: Acute Care | Admitting: Acute Care

## 2022-04-22 DIAGNOSIS — F17210 Nicotine dependence, cigarettes, uncomplicated: Secondary | ICD-10-CM | POA: Diagnosis not present

## 2022-04-26 ENCOUNTER — Other Ambulatory Visit: Payer: Self-pay

## 2022-04-26 DIAGNOSIS — Z87891 Personal history of nicotine dependence: Secondary | ICD-10-CM

## 2022-04-26 DIAGNOSIS — F17210 Nicotine dependence, cigarettes, uncomplicated: Secondary | ICD-10-CM

## 2022-04-26 DIAGNOSIS — Z122 Encounter for screening for malignant neoplasm of respiratory organs: Secondary | ICD-10-CM

## 2022-05-13 ENCOUNTER — Other Ambulatory Visit: Payer: Self-pay | Admitting: Internal Medicine

## 2022-07-06 DIAGNOSIS — M8580 Other specified disorders of bone density and structure, unspecified site: Secondary | ICD-10-CM | POA: Diagnosis not present

## 2022-07-06 DIAGNOSIS — E785 Hyperlipidemia, unspecified: Secondary | ICD-10-CM | POA: Diagnosis not present

## 2022-07-13 DIAGNOSIS — R7301 Impaired fasting glucose: Secondary | ICD-10-CM | POA: Diagnosis not present

## 2022-07-13 DIAGNOSIS — Z1331 Encounter for screening for depression: Secondary | ICD-10-CM | POA: Diagnosis not present

## 2022-07-13 DIAGNOSIS — Z1389 Encounter for screening for other disorder: Secondary | ICD-10-CM | POA: Diagnosis not present

## 2022-07-13 DIAGNOSIS — Z0000 Encounter for general adult medical examination without abnormal findings: Secondary | ICD-10-CM | POA: Diagnosis not present

## 2022-09-03 ENCOUNTER — Other Ambulatory Visit: Payer: Self-pay | Admitting: Internal Medicine

## 2022-09-03 NOTE — Telephone Encounter (Signed)
Please send courtesy refill and call to schedule follow-up.

## 2022-09-22 DIAGNOSIS — M545 Low back pain, unspecified: Secondary | ICD-10-CM | POA: Diagnosis not present

## 2022-09-22 DIAGNOSIS — R12 Heartburn: Secondary | ICD-10-CM | POA: Diagnosis not present

## 2022-09-25 ENCOUNTER — Other Ambulatory Visit: Payer: Self-pay | Admitting: Internal Medicine

## 2022-12-10 ENCOUNTER — Other Ambulatory Visit: Payer: Self-pay | Admitting: Internal Medicine

## 2022-12-31 DIAGNOSIS — F17200 Nicotine dependence, unspecified, uncomplicated: Secondary | ICD-10-CM | POA: Diagnosis not present

## 2022-12-31 DIAGNOSIS — M8580 Other specified disorders of bone density and structure, unspecified site: Secondary | ICD-10-CM | POA: Diagnosis not present

## 2022-12-31 DIAGNOSIS — J449 Chronic obstructive pulmonary disease, unspecified: Secondary | ICD-10-CM | POA: Diagnosis not present

## 2022-12-31 DIAGNOSIS — R7301 Impaired fasting glucose: Secondary | ICD-10-CM | POA: Diagnosis not present

## 2022-12-31 DIAGNOSIS — I700 Atherosclerosis of aorta: Secondary | ICD-10-CM | POA: Diagnosis not present

## 2023-01-18 DIAGNOSIS — R5383 Other fatigue: Secondary | ICD-10-CM | POA: Diagnosis not present

## 2023-01-18 DIAGNOSIS — R7989 Other specified abnormal findings of blood chemistry: Secondary | ICD-10-CM | POA: Diagnosis not present

## 2023-01-18 DIAGNOSIS — M8589 Other specified disorders of bone density and structure, multiple sites: Secondary | ICD-10-CM | POA: Diagnosis not present

## 2023-01-18 DIAGNOSIS — E785 Hyperlipidemia, unspecified: Secondary | ICD-10-CM | POA: Diagnosis not present

## 2023-01-18 DIAGNOSIS — R252 Cramp and spasm: Secondary | ICD-10-CM | POA: Diagnosis not present

## 2023-01-18 DIAGNOSIS — R7301 Impaired fasting glucose: Secondary | ICD-10-CM | POA: Diagnosis not present

## 2023-02-01 DIAGNOSIS — M6281 Muscle weakness (generalized): Secondary | ICD-10-CM | POA: Diagnosis not present

## 2023-02-01 DIAGNOSIS — M25559 Pain in unspecified hip: Secondary | ICD-10-CM | POA: Diagnosis not present

## 2023-02-01 DIAGNOSIS — M25561 Pain in right knee: Secondary | ICD-10-CM | POA: Diagnosis not present

## 2023-02-01 DIAGNOSIS — M25562 Pain in left knee: Secondary | ICD-10-CM | POA: Diagnosis not present

## 2023-02-03 DIAGNOSIS — M6281 Muscle weakness (generalized): Secondary | ICD-10-CM | POA: Diagnosis not present

## 2023-02-03 DIAGNOSIS — M25561 Pain in right knee: Secondary | ICD-10-CM | POA: Diagnosis not present

## 2023-02-03 DIAGNOSIS — M25562 Pain in left knee: Secondary | ICD-10-CM | POA: Diagnosis not present

## 2023-02-03 DIAGNOSIS — M25559 Pain in unspecified hip: Secondary | ICD-10-CM | POA: Diagnosis not present

## 2023-02-08 DIAGNOSIS — M25561 Pain in right knee: Secondary | ICD-10-CM | POA: Diagnosis not present

## 2023-02-08 DIAGNOSIS — M6281 Muscle weakness (generalized): Secondary | ICD-10-CM | POA: Diagnosis not present

## 2023-02-08 DIAGNOSIS — M25562 Pain in left knee: Secondary | ICD-10-CM | POA: Diagnosis not present

## 2023-02-08 DIAGNOSIS — M25559 Pain in unspecified hip: Secondary | ICD-10-CM | POA: Diagnosis not present

## 2023-02-09 DIAGNOSIS — L820 Inflamed seborrheic keratosis: Secondary | ICD-10-CM | POA: Diagnosis not present

## 2023-02-09 DIAGNOSIS — Z789 Other specified health status: Secondary | ICD-10-CM | POA: Diagnosis not present

## 2023-03-15 DIAGNOSIS — M6281 Muscle weakness (generalized): Secondary | ICD-10-CM | POA: Diagnosis not present

## 2023-03-15 DIAGNOSIS — M25559 Pain in unspecified hip: Secondary | ICD-10-CM | POA: Diagnosis not present

## 2023-03-15 DIAGNOSIS — M25562 Pain in left knee: Secondary | ICD-10-CM | POA: Diagnosis not present

## 2023-03-15 DIAGNOSIS — M25561 Pain in right knee: Secondary | ICD-10-CM | POA: Diagnosis not present

## 2023-03-17 DIAGNOSIS — M25562 Pain in left knee: Secondary | ICD-10-CM | POA: Diagnosis not present

## 2023-03-17 DIAGNOSIS — M6281 Muscle weakness (generalized): Secondary | ICD-10-CM | POA: Diagnosis not present

## 2023-03-17 DIAGNOSIS — M25559 Pain in unspecified hip: Secondary | ICD-10-CM | POA: Diagnosis not present

## 2023-03-17 DIAGNOSIS — M25561 Pain in right knee: Secondary | ICD-10-CM | POA: Diagnosis not present

## 2023-03-31 DIAGNOSIS — U071 COVID-19: Secondary | ICD-10-CM | POA: Diagnosis not present

## 2023-04-25 ENCOUNTER — Inpatient Hospital Stay: Admission: RE | Admit: 2023-04-25 | Payer: BLUE CROSS/BLUE SHIELD | Source: Ambulatory Visit

## 2023-05-10 ENCOUNTER — Telehealth: Payer: Self-pay | Admitting: Acute Care

## 2023-05-10 NOTE — Telephone Encounter (Signed)
Patient states needs new order for CT scan. Would like to reschedule. Patient phone number is [phone].

## 2023-05-10 NOTE — Telephone Encounter (Signed)
Need a new order

## 2023-05-11 ENCOUNTER — Other Ambulatory Visit: Payer: Self-pay

## 2023-05-11 DIAGNOSIS — Z87891 Personal history of nicotine dependence: Secondary | ICD-10-CM

## 2023-05-11 DIAGNOSIS — F17210 Nicotine dependence, cigarettes, uncomplicated: Secondary | ICD-10-CM

## 2023-05-11 DIAGNOSIS — Z122 Encounter for screening for malignant neoplasm of respiratory organs: Secondary | ICD-10-CM

## 2023-05-26 ENCOUNTER — Ambulatory Visit
Admission: RE | Admit: 2023-05-26 | Discharge: 2023-05-26 | Disposition: A | Discharge status: Home / Self Care | Payer: BLUE CROSS/BLUE SHIELD | Source: Ambulatory Visit | Attending: Internal Medicine | Admitting: Internal Medicine

## 2023-05-26 DIAGNOSIS — Z87891 Personal history of nicotine dependence: Secondary | ICD-10-CM

## 2023-05-26 DIAGNOSIS — Z122 Encounter for screening for malignant neoplasm of respiratory organs: Secondary | ICD-10-CM

## 2023-05-26 DIAGNOSIS — F17210 Nicotine dependence, cigarettes, uncomplicated: Secondary | ICD-10-CM

## 2023-06-13 ENCOUNTER — Other Ambulatory Visit: Payer: Self-pay | Admitting: Acute Care

## 2023-06-13 DIAGNOSIS — Z87891 Personal history of nicotine dependence: Secondary | ICD-10-CM

## 2023-06-13 DIAGNOSIS — Z122 Encounter for screening for malignant neoplasm of respiratory organs: Secondary | ICD-10-CM

## 2023-06-13 DIAGNOSIS — F17210 Nicotine dependence, cigarettes, uncomplicated: Secondary | ICD-10-CM

## 2023-07-07 DIAGNOSIS — R7301 Impaired fasting glucose: Secondary | ICD-10-CM | POA: Diagnosis not present

## 2023-07-07 DIAGNOSIS — R5383 Other fatigue: Secondary | ICD-10-CM | POA: Diagnosis not present

## 2023-07-07 DIAGNOSIS — E785 Hyperlipidemia, unspecified: Secondary | ICD-10-CM | POA: Diagnosis not present

## 2023-07-20 DIAGNOSIS — R1319 Other dysphagia: Secondary | ICD-10-CM | POA: Diagnosis not present

## 2023-07-20 DIAGNOSIS — Z1331 Encounter for screening for depression: Secondary | ICD-10-CM | POA: Diagnosis not present

## 2023-07-20 DIAGNOSIS — Z0000 Encounter for general adult medical examination without abnormal findings: Secondary | ICD-10-CM | POA: Diagnosis not present

## 2023-08-23 DIAGNOSIS — R208 Other disturbances of skin sensation: Secondary | ICD-10-CM | POA: Diagnosis not present

## 2023-08-23 DIAGNOSIS — L538 Other specified erythematous conditions: Secondary | ICD-10-CM | POA: Diagnosis not present

## 2023-08-23 DIAGNOSIS — D225 Melanocytic nevi of trunk: Secondary | ICD-10-CM | POA: Diagnosis not present

## 2023-08-23 DIAGNOSIS — L820 Inflamed seborrheic keratosis: Secondary | ICD-10-CM | POA: Diagnosis not present

## 2023-08-23 DIAGNOSIS — L2989 Other pruritus: Secondary | ICD-10-CM | POA: Diagnosis not present

## 2023-08-23 DIAGNOSIS — L821 Other seborrheic keratosis: Secondary | ICD-10-CM | POA: Diagnosis not present

## 2023-08-23 DIAGNOSIS — L814 Other melanin hyperpigmentation: Secondary | ICD-10-CM | POA: Diagnosis not present

## 2023-10-18 DIAGNOSIS — T148XXA Other injury of unspecified body region, initial encounter: Secondary | ICD-10-CM | POA: Diagnosis not present

## 2023-10-18 DIAGNOSIS — J449 Chronic obstructive pulmonary disease, unspecified: Secondary | ICD-10-CM | POA: Diagnosis not present

## 2024-01-13 DIAGNOSIS — R7303 Prediabetes: Secondary | ICD-10-CM | POA: Diagnosis not present

## 2024-01-13 DIAGNOSIS — J449 Chronic obstructive pulmonary disease, unspecified: Secondary | ICD-10-CM | POA: Diagnosis not present

## 2024-04-02 DIAGNOSIS — R051 Acute cough: Secondary | ICD-10-CM | POA: Diagnosis not present

## 2024-04-02 DIAGNOSIS — J069 Acute upper respiratory infection, unspecified: Secondary | ICD-10-CM | POA: Diagnosis not present

## 2024-04-02 DIAGNOSIS — R062 Wheezing: Secondary | ICD-10-CM | POA: Diagnosis not present

## 2024-04-13 DIAGNOSIS — R7303 Prediabetes: Secondary | ICD-10-CM | POA: Diagnosis not present

## 2024-04-13 DIAGNOSIS — J449 Chronic obstructive pulmonary disease, unspecified: Secondary | ICD-10-CM | POA: Diagnosis not present

## 2024-05-22 ENCOUNTER — Other Ambulatory Visit: Payer: Self-pay | Admitting: Internal Medicine

## 2024-05-22 DIAGNOSIS — Z1231 Encounter for screening mammogram for malignant neoplasm of breast: Secondary | ICD-10-CM

## 2024-05-30 ENCOUNTER — Other Ambulatory Visit

## 2024-05-31 ENCOUNTER — Ambulatory Visit
Admission: RE | Admit: 2024-05-31 | Discharge: 2024-05-31 | Disposition: A | Discharge status: Home / Self Care | Source: Ambulatory Visit | Attending: Acute Care | Admitting: Acute Care

## 2024-05-31 ENCOUNTER — Ambulatory Visit
Admission: RE | Admit: 2024-05-31 | Discharge: 2024-05-31 | Disposition: A | Discharge status: Home / Self Care | Source: Ambulatory Visit | Attending: Internal Medicine | Admitting: Internal Medicine

## 2024-05-31 DIAGNOSIS — Z87891 Personal history of nicotine dependence: Secondary | ICD-10-CM

## 2024-05-31 DIAGNOSIS — Z122 Encounter for screening for malignant neoplasm of respiratory organs: Secondary | ICD-10-CM

## 2024-05-31 DIAGNOSIS — F17210 Nicotine dependence, cigarettes, uncomplicated: Secondary | ICD-10-CM | POA: Diagnosis not present

## 2024-05-31 DIAGNOSIS — Z1231 Encounter for screening mammogram for malignant neoplasm of breast: Secondary | ICD-10-CM

## 2024-06-06 ENCOUNTER — Other Ambulatory Visit: Payer: Self-pay

## 2024-06-06 DIAGNOSIS — F17210 Nicotine dependence, cigarettes, uncomplicated: Secondary | ICD-10-CM

## 2024-06-06 DIAGNOSIS — Z87891 Personal history of nicotine dependence: Secondary | ICD-10-CM

## 2024-06-06 DIAGNOSIS — Z122 Encounter for screening for malignant neoplasm of respiratory organs: Secondary | ICD-10-CM

## 2024-07-18 DIAGNOSIS — Z124 Encounter for screening for malignant neoplasm of cervix: Secondary | ICD-10-CM | POA: Diagnosis not present

## 2024-07-18 DIAGNOSIS — Z6830 Body mass index (BMI) 30.0-30.9, adult: Secondary | ICD-10-CM | POA: Diagnosis not present

## 2024-07-18 DIAGNOSIS — Z1151 Encounter for screening for human papillomavirus (HPV): Secondary | ICD-10-CM | POA: Diagnosis not present

## 2024-07-18 DIAGNOSIS — Z01419 Encounter for gynecological examination (general) (routine) without abnormal findings: Secondary | ICD-10-CM | POA: Diagnosis not present

## 2024-07-25 DIAGNOSIS — Z1389 Encounter for screening for other disorder: Secondary | ICD-10-CM | POA: Diagnosis not present

## 2024-07-25 DIAGNOSIS — Z1331 Encounter for screening for depression: Secondary | ICD-10-CM | POA: Diagnosis not present

## 2024-07-25 DIAGNOSIS — Z0000 Encounter for general adult medical examination without abnormal findings: Secondary | ICD-10-CM | POA: Diagnosis not present

## 2024-07-25 DIAGNOSIS — J449 Chronic obstructive pulmonary disease, unspecified: Secondary | ICD-10-CM | POA: Diagnosis not present

## 2024-07-25 DIAGNOSIS — F17200 Nicotine dependence, unspecified, uncomplicated: Secondary | ICD-10-CM | POA: Diagnosis not present
